# Patient Record
Sex: Female | Born: 1985 | Race: White | Hispanic: No | Marital: Single | Smoking: Never smoker
Health system: Southern US, Community
[De-identification: ages and names within clinical notes are randomized; demographics above are authoritative.]

## PROBLEM LIST (undated history)

## (undated) DIAGNOSIS — F32A Depression, unspecified: Secondary | ICD-10-CM

## (undated) DIAGNOSIS — A749 Chlamydial infection, unspecified: Secondary | ICD-10-CM

## (undated) DIAGNOSIS — F329 Major depressive disorder, single episode, unspecified: Secondary | ICD-10-CM

## (undated) DIAGNOSIS — R87610 Atypical squamous cells of undetermined significance on cytologic smear of cervix (ASC-US): Secondary | ICD-10-CM

## (undated) DIAGNOSIS — M21619 Bunion of unspecified foot: Secondary | ICD-10-CM

## (undated) DIAGNOSIS — R011 Cardiac murmur, unspecified: Secondary | ICD-10-CM

## (undated) DIAGNOSIS — R87612 Low grade squamous intraepithelial lesion on cytologic smear of cervix (LGSIL): Secondary | ICD-10-CM

## (undated) DIAGNOSIS — F502 Bulimia nervosa, unspecified: Secondary | ICD-10-CM

## (undated) DIAGNOSIS — F419 Anxiety disorder, unspecified: Secondary | ICD-10-CM

## (undated) HISTORY — DX: Chlamydial infection, unspecified: A74.9

## (undated) HISTORY — PX: FOOT SURGERY: SHX648

## (undated) HISTORY — DX: Atypical squamous cells of undetermined significance on cytologic smear of cervix (ASC-US): R87.610

## (undated) HISTORY — DX: Bulimia nervosa, unspecified: F50.20

## (undated) HISTORY — DX: Low grade squamous intraepithelial lesion on cytologic smear of cervix (LGSIL): R87.612

## (undated) HISTORY — DX: Bunion of unspecified foot: M21.619

## (undated) HISTORY — DX: Bulimia nervosa: F50.2

## (undated) HISTORY — DX: Major depressive disorder, single episode, unspecified: F32.9

## (undated) HISTORY — DX: Depression, unspecified: F32.A

## (undated) HISTORY — DX: Anxiety disorder, unspecified: F41.9

## (undated) SURGERY — Surgical Case
Anesthesia: Choice

---

## 2011-01-15 ENCOUNTER — Ambulatory Visit: Payer: Self-pay | Admitting: Internal Medicine

## 2014-11-17 DIAGNOSIS — A749 Chlamydial infection, unspecified: Secondary | ICD-10-CM

## 2014-11-17 DIAGNOSIS — R87610 Atypical squamous cells of undetermined significance on cytologic smear of cervix (ASC-US): Secondary | ICD-10-CM

## 2014-11-17 HISTORY — DX: Atypical squamous cells of undetermined significance on cytologic smear of cervix (ASC-US): R87.610

## 2014-11-17 HISTORY — DX: Chlamydial infection, unspecified: A74.9

## 2015-04-05 ENCOUNTER — Emergency Department
Admission: EM | Admit: 2015-04-05 | Discharge: 2015-04-05 | Disposition: A | Discharge status: Other Acute Inpatient Hospital | Payer: Self-pay | Attending: Emergency Medicine | Admitting: Emergency Medicine

## 2015-04-05 ENCOUNTER — Encounter: Payer: Self-pay | Admitting: *Deleted

## 2015-04-05 ENCOUNTER — Inpatient Hospital Stay
Admission: EM | Admit: 2015-04-05 | Discharge: 2015-04-08 | DRG: 881 | Disposition: A | Discharge status: Home / Self Care | Payer: No Typology Code available for payment source | Attending: Psychiatry | Admitting: Psychiatry

## 2015-04-05 DIAGNOSIS — Z720 Tobacco use: Secondary | ICD-10-CM | POA: Insufficient documentation

## 2015-04-05 DIAGNOSIS — Z9114 Patient's other noncompliance with medication regimen: Secondary | ICD-10-CM | POA: Diagnosis present

## 2015-04-05 DIAGNOSIS — F322 Major depressive disorder, single episode, severe without psychotic features: Secondary | ICD-10-CM

## 2015-04-05 DIAGNOSIS — Z590 Homelessness: Secondary | ICD-10-CM

## 2015-04-05 DIAGNOSIS — F10239 Alcohol dependence with withdrawal, unspecified: Secondary | ICD-10-CM | POA: Diagnosis present

## 2015-04-05 DIAGNOSIS — R45851 Suicidal ideations: Secondary | ICD-10-CM | POA: Diagnosis present

## 2015-04-05 DIAGNOSIS — G4700 Insomnia, unspecified: Secondary | ICD-10-CM | POA: Diagnosis present

## 2015-04-05 DIAGNOSIS — F32A Depression, unspecified: Secondary | ICD-10-CM

## 2015-04-05 DIAGNOSIS — F502 Bulimia nervosa: Secondary | ICD-10-CM | POA: Diagnosis present

## 2015-04-05 DIAGNOSIS — F329 Major depressive disorder, single episode, unspecified: Secondary | ICD-10-CM | POA: Diagnosis present

## 2015-04-05 DIAGNOSIS — F419 Anxiety disorder, unspecified: Secondary | ICD-10-CM | POA: Diagnosis present

## 2015-04-05 DIAGNOSIS — F10939 Alcohol use, unspecified with withdrawal, unspecified: Secondary | ICD-10-CM

## 2015-04-05 DIAGNOSIS — F332 Major depressive disorder, recurrent severe without psychotic features: Secondary | ICD-10-CM

## 2015-04-05 DIAGNOSIS — F1020 Alcohol dependence, uncomplicated: Secondary | ICD-10-CM

## 2015-04-05 LAB — ETHANOL

## 2015-04-05 LAB — COMPREHENSIVE METABOLIC PANEL
ALT: 21 U/L (ref 14–54)
ANION GAP: 9 (ref 5–15)
AST: 25 U/L (ref 15–41)
Albumin: 4.6 g/dL (ref 3.5–5.0)
Alkaline Phosphatase: 73 U/L (ref 38–126)
BILIRUBIN TOTAL: 0.8 mg/dL (ref 0.3–1.2)
BUN: 11 mg/dL (ref 6–20)
CO2: 27 mmol/L (ref 22–32)
Calcium: 9.2 mg/dL (ref 8.9–10.3)
Chloride: 103 mmol/L (ref 101–111)
Creatinine, Ser: 0.79 mg/dL (ref 0.44–1.00)
GFR calc Af Amer: >60 mL/min (ref >60)
GFR calc non Af Amer: >60 mL/min (ref >60)
Glucose, Bld: 101 mg/dL — ABNORMAL HIGH (ref 65–99)
Potassium: 4.2 mmol/L (ref 3.5–5.1)
SODIUM: 139 mmol/L (ref 135–145)
TOTAL PROTEIN: 7.1 g/dL (ref 6.5–8.1)

## 2015-04-05 LAB — SALICYLATE LEVEL

## 2015-04-05 LAB — CBC
HEMATOCRIT: 41.3 % (ref 35.0–47.0)
Hemoglobin: 13.9 g/dL (ref 12.0–16.0)
MCH: 33.8 pg (ref 26.0–34.0)
MCHC: 33.7 g/dL (ref 32.0–36.0)
MCV: 100.4 fL — ABNORMAL HIGH (ref 80.0–100.0)
PLATELETS: 187 10*3/uL (ref 150–440)
RBC: 4.11 MIL/uL (ref 3.80–5.20)
RDW: 15.1 % — ABNORMAL HIGH (ref 11.5–14.5)
WBC: 6.5 10*3/uL (ref 3.6–11.0)

## 2015-04-05 LAB — URINE DRUG SCREEN, QUALITATIVE (ARMC ONLY)
Amphetamines, Ur Screen: NOT DETECTED
BENZODIAZEPINE, UR SCRN: NOT DETECTED
Barbiturates, Ur Screen: NOT DETECTED
CANNABINOID 50 NG, UR ~~LOC~~: NOT DETECTED
Cocaine Metabolite,Ur ~~LOC~~: NOT DETECTED
MDMA (ECSTASY) UR SCREEN: NOT DETECTED
METHADONE SCREEN, URINE: NOT DETECTED
OPIATE, UR SCREEN: NOT DETECTED
Phencyclidine (PCP) Ur S: NOT DETECTED
Tricyclic, Ur Screen: NOT DETECTED

## 2015-04-05 LAB — PREGNANCY, URINE: Preg Test, Ur: NEGATIVE

## 2015-04-05 LAB — ACETAMINOPHEN LEVEL

## 2015-04-05 MED ORDER — CLONIDINE HCL 0.1 MG PO TABS
0.1000 mg | ORAL_TABLET | Freq: Once | ORAL | Status: AC
  Administered 2015-04-05: 0.1 mg via ORAL
  Filled 2015-04-05: qty 1

## 2015-04-05 MED ORDER — CHLORDIAZEPOXIDE HCL 25 MG PO CAPS
25.0000 mg | ORAL_CAPSULE | Freq: Three times a day (TID) | ORAL | Status: DC
  Administered 2015-04-05 – 2015-04-07 (×6): 25 mg via ORAL
  Filled 2015-04-05 (×6): qty 1

## 2015-04-05 MED ORDER — TRAZODONE HCL 100 MG PO TABS
100.0000 mg | ORAL_TABLET | Freq: Every evening | ORAL | Status: DC | PRN
  Administered 2015-04-05 – 2015-04-06 (×2): 100 mg via ORAL
  Filled 2015-04-05 (×2): qty 1

## 2015-04-05 MED ORDER — ALUM & MAG HYDROXIDE-SIMETH 200-200-20 MG/5ML PO SUSP
30.0000 mL | ORAL | Status: DC | PRN
Start: 2015-04-05 — End: 2015-04-08

## 2015-04-05 MED ORDER — MAGNESIUM HYDROXIDE 400 MG/5ML PO SUSP
30.0000 mL | Freq: Every day | ORAL | Status: DC | PRN
Start: 2015-04-05 — End: 2015-04-08

## 2015-04-05 MED ORDER — ACETAMINOPHEN 325 MG PO TABS
650.0000 mg | ORAL_TABLET | Freq: Four times a day (QID) | ORAL | Status: DC | PRN
  Administered 2015-04-05: 650 mg via ORAL
  Filled 2015-04-05: qty 2

## 2015-04-05 MED ORDER — LORAZEPAM 2 MG PO TABS: 2.0000 mg | ORAL_TABLET | Freq: Three times a day (TID) | ORAL | Status: DC | PRN

## 2015-04-05 MED ORDER — AMLODIPINE BESYLATE 5 MG PO TABS
5.0000 mg | ORAL_TABLET | Freq: Every day | ORAL | Status: DC
  Administered 2015-04-05 – 2015-04-06 (×2): 5 mg via ORAL
  Filled 2015-04-05 (×2): qty 1

## 2015-04-05 MED ORDER — CLONIDINE HCL 0.1 MG PO TABS: 0.1000 mg | ORAL_TABLET | Freq: Three times a day (TID) | ORAL | Status: DC | PRN
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
## 2015-04-05 NOTE — ED Notes (Signed)
BEHAVIORAL HEALTH ROUNDING Patient sleeping: No. Patient alert and oriented: yes Behavior appropriate: Yes.  ; If no, describe:  Nutrition and fluids offered: Yes  Toileting and hygiene offered: Yes  Sitter present: no Law enforcement present: Yes  

## 2015-04-05 NOTE — ED Notes (Signed)
BEHAVIORAL HEALTH ROUNDING Patient sleeping: No. Patient alert and oriented: yes Behavior appropriate: Yes.  ; If no, describe:  Nutrition and fluids offered: Yes  Toileting and hygiene offered: Yes  Sitter present: no Law enforcement present: Yes   Pt's mother at bedside
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
## 2015-04-05 NOTE — Tx Team (Signed)
Initial Interdisciplinary Treatment Plan   PATIENT STRESSORS: Financial difficulties Substance abuse   PATIENT STRENGTHS: Average or above average intelligence Capable of independent living Communication skills   PROBLEM LIST: Problem List/Patient Goals Date to be addressed Date deferred Reason deferred Estimated date of resolution  Family issues      Alcohol abuse                                                 DISCHARGE CRITERIA:  Improved stabilization in mood, thinking, and/or behavior Verbal commitment to aftercare and medication compliance Withdrawal symptoms are absent or subacute and managed without 24-hour nursing intervention  PRELIMINARY DISCHARGE PLAN: Return to previous living arrangement  PATIENT/FAMIILY INVOLVEMENT: This treatment plan has been presented to and reviewed with the patient, Monique Kaufman, and/or family member, .  The patient and family have been given the opportunity to ask questions and make suggestions.  Monique Kaufman B 04/05/2015, 7:43 PM

## 2015-04-05 NOTE — ED Notes (Signed)
Father at bedside.

## 2015-04-05 NOTE — ED Notes (Signed)
Patient assigned to appropriate care area. Patient oriented to unit/care area: Informed that, for their safety, care areas are designed for safety and monitored by security cameras at all times; and visiting hours explained to patient. Patient verbalizes understanding, and verbal contract for safety obtained. 

## 2015-04-05 NOTE — ED Notes (Signed)
Attempted to call report; no answer in Beh unit.
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
## 2015-04-05 NOTE — ED Notes (Signed)
[facility] will call when they are finished with current admission

## 2015-04-05 NOTE — ED Notes (Signed)
BEHAVIORAL HEALTH ROUNDING Patient sleeping: No. Patient alert and oriented: yes Behavior appropriate: Yes.  ; If no, describe:  Nutrition and fluids offered: Yes  Toileting and hygiene offered: Yes  Sitter present: no Law enforcement present: Yes BEHAVIORAL HEALTH ROUNDING Patient sleeping: No. Patient alert and oriented: yes Behavior appropriate: Yes.  ; If no, describe: Nutrition and fluids offered: Yes  Toileting and hygiene offered: Yes  Sitter present: no Law enforcement present: No
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
## 2015-04-05 NOTE — ED Notes (Signed)

## 2015-04-05 NOTE — ED Notes (Signed)
BEHAVIORAL HEALTH ROUNDING Patient sleeping: No. Patient alert and oriented: yes Behavior appropriate: Yes.  ; If no, describe: tearful Nutrition and fluids offered: Yes  Toileting and hygiene offered: No Sitter present: no Law enforcement present: Yes

## 2015-04-05 NOTE — ED Notes (Signed)
Pt comes in POV c/o suicidal ideation.  Denies suicidal plan but states " I would be better off not here".  Expresses family problems at home.  Patient states she has recently gone through a divorce in the past couple of months, loss of employment, and no family support.  Patient has recently been seeing a therapist and was escorted here by the therapist.

## 2015-04-05 NOTE — ED Notes (Signed)
Dr.Clapacs at bedside  

## 2015-04-05 NOTE — ED Provider Notes (Signed)
[facility] Emergency Department Provider Note    ____________________________________________  Time seen: 1120  I have reviewed the triage vital signs and the nursing notes.   HISTORY  Chief Complaint Suicidal   History limited by: Not Limited   HPI Monique Kaufman is a 29 y.o. female who presents to the emergency department accompanied by her counselor. The patient states that she has been having a hard time with her mental health recently. She states that she finds herself stealing from those that she lives. The guilt of stealing, most recently from her boyfriend causes it or have thoughts that people would be better off if she was not around. She does not have any plans to hurt herself nor has tried in the past. She states that she has had therapy for this in the past which has helped. She did move away from the area for a little while but then recently moved back. She has been talking to her counselor who brought her in today. She denies any medical complaints.     History reviewed. No pertinent past medical history.  There are no active problems to display for this patient.   History reviewed. No pertinent past surgical history.  No current outpatient prescriptions on file.  Allergies Review of patient's allergies indicates no known allergies.  No family history on file.  Social History History  Substance Use Topics  . Smoking status: Current Every Day Smoker  . Smokeless tobacco: Not on file  . Alcohol Use: Yes    Review of Systems  Constitutional: Negative for fever. Cardiovascular: Negative for chest pain. Respiratory: Negative for shortness of breath. Gastrointestinal: Negative for abdominal pain, vomiting and diarrhea. Genitourinary: Negative for dysuria. Musculoskeletal: Negative for back pain. Skin: Negative for rash. Neurological: Negative for headaches, focal weakness or numbness.  10-point ROS otherwise  negative.  ____________________________________________   PHYSICAL EXAM:  VITAL SIGNS: ED Triage Vitals  Enc Vitals Group     BP 04/05/15 0947 203/134 mmHg     Pulse Rate 04/05/15 0947 71     Resp 04/05/15 0947 18     Temp 04/05/15 0947 98.2 F (36.8 C)     Temp Source 04/05/15 0947 Oral     SpO2 04/05/15 0947 99 %     Weight 04/05/15 0947 130 lb (58.968 kg)     Height 04/05/15 0947  (1.6 m)   Constitutional: Alert and oriented. Tearful Eyes: Conjunctivae are normal. PERRL. Normal extraocular movements. ENT   Head: Normocephalic and atraumatic.   Nose: No congestion/rhinnorhea.   Mouth/Throat: Mucous membranes are moist.   Neck: No stridor. Hematological/Lymphatic/Immunilogical: No cervical lymphadenopathy. Cardiovascular: Normal rate, regular rhythm.  No murmurs, rubs, or gallops. Respiratory: Normal respiratory effort without tachypnea nor retractions. Breath sounds are clear and equal bilaterally. No wheezes/rales/rhonchi. Gastrointestinal: Soft and nontender. No distention. There is no CVA tenderness. Genitourinary: Deferred Musculoskeletal: Normal range of motion in all extremities. No joint effusions.  No lower extremity tenderness nor edema. Neurologic:  Normal speech and language. No gross focal neurologic deficits are appreciated. Speech is normal.  Skin:  Skin is warm, dry and intact. No rash noted. Psychiatric: Depressed, tearful  ____________________________________________    LABS (pertinent positives/negatives)  Labs Reviewed  COMPREHENSIVE METABOLIC PANEL - Abnormal; Notable for the following:    Glucose, Bld 101 (*)    All other components within normal limits  ACETAMINOPHEN LEVEL - Abnormal; Notable for the following:    Acetaminophen (Tylenol), Serum <10 (*)  All other components within normal limits  CBC - Abnormal; Notable for the following:    MCV 100.4 (*)    RDW 15.1 (*)    All other components within normal limits   ETHANOL  SALICYLATE LEVEL  URINE DRUG SCREEN, QUALITATIVE ([facility] ONLY)     ____________________________________________   EKG  None  ____________________________________________    RADIOLOGY  None  ____________________________________________   PROCEDURES  Procedure(s) performed: None  Critical Care performed: No  ____________________________________________   INITIAL IMPRESSION / ASSESSMENT AND PLAN / ED COURSE  Pertinent labs & imaging results that were available during my care of the patient were reviewed by me and considered in my medical decision making (see chart for details).  Patient presents with concerns for depression, some thoughts of not feeling like she should be here. Psychiatry has seen the patient and she will be admitted. Psychiatry agrees that she is not committable at this time however she is voluntarily willing to be admitted to the hospital.  ____________________________________________   FINAL CLINICAL IMPRESSION(S) / ED DIAGNOSES  Final diagnoses:  Depression     Phineas SemenGraydon Garlen Reinig, MD 04/05/15 1312

## 2015-04-06 DIAGNOSIS — F1020 Alcohol dependence, uncomplicated: Secondary | ICD-10-CM

## 2015-04-06 DIAGNOSIS — F502 Bulimia nervosa: Secondary | ICD-10-CM

## 2015-04-06 DIAGNOSIS — F322 Major depressive disorder, single episode, severe without psychotic features: Secondary | ICD-10-CM

## 2015-04-06 DIAGNOSIS — F10239 Alcohol dependence with withdrawal, unspecified: Secondary | ICD-10-CM

## 2015-04-06 DIAGNOSIS — F10939 Alcohol use, unspecified with withdrawal, unspecified: Secondary | ICD-10-CM

## 2015-04-06 LAB — VITAMIN B12: Vitamin B-12: 137 pg/mL — ABNORMAL LOW (ref 180–914)

## 2015-04-06 LAB — TSH: TSH: 1.004 u[IU]/mL (ref 0.350–4.500)

## 2015-04-06 MED ORDER — FLUOXETINE HCL 20 MG PO CAPS
20.0000 mg | ORAL_CAPSULE | Freq: Every day | ORAL | Status: DC
  Administered 2015-04-06 – 2015-04-08 (×3): 20 mg via ORAL
  Filled 2015-04-06 (×3): qty 1

## 2015-04-06 NOTE — Progress Notes (Signed)
Recreation Therapy Notes  Date: 07.12.16 Time: 3:00 pm Location: Craft Room  Group Topic: Self-esteem  Goal Area(s) Addresses:  Patient will write at least one positive trait about self. Patient will verbalize benefit of having a good self-esteem.  Behavioral Response: Attentive, Interactive   Intervention: I Am  Activity: Patients were given a worksheet with the letter I and instructed to fill the letter with positive traits about themselves.  Education: LRT educated patients on different ways to increase self-esteem   Education Outcome: Acknowledges education/In group clarification offered  Clinical Observations/Feedback: Patient completed worksheet by listing positive traits about herself. Patient contributed to group discussion by stating it was difficult to think of positive traits towards the end, and how it felt to see her strengths on paper, how her self-esteem affects her.  Jacquelynn CreeGreene,Liseth Wann M, LRT/CTRS 04/06/2015 4:06 PM

## 2015-04-06 NOTE — Plan of Care (Signed)
Problem: Ineffective individual coping Goal: LTG: Patient will report a decrease in negative feelings Outcome: Not Progressing Labile and tearful.  Continues to feel that she is a screw.

## 2015-04-06 NOTE — [facility] Group Notes (Signed)
[facility] Note  04/06/2015 3:13 PM  Participation Quality:  Appropriate  Affect:  Appropriate  Cognitive:  Appropriate  Insight:  Engaged  Engagement in Group:  Engaged  Modes of Intervention:  Discussion, Education, Exploration and Rapport Building  Summary of Progress/Problems: Patients smart goal for today is to stay positive, ask for meeting with SW and doctor and let her nurse know when she is feeling upset.  Arrie SenateBandi, Lania Zawistowski M 04/06/2015, 3:13 PM

## 2015-04-06 NOTE — Progress Notes (Signed)
Patient has been in the milieu. Currently in the day room with staff and peers. Alert and oriented, pleasant and cooperative. Currently denying SI/HI. Actively participated in evening group. Patient  Had a snack and took medication voluntarily. No withdrawal symptoms noted. Emotional support and encouragements offered and safety precautions maintained.

## 2015-04-06 NOTE — Plan of Care (Signed)
Problem: Ineffective individual coping Goal: STG: Patient will remain free from self harm Outcome: Progressing Patient remains safe, no self harm behavior

## 2015-04-06 NOTE — Progress Notes (Signed)
[facility] MD Progress Note  04/07/2015 10:55 AM PUNAM BROUSSARD  MRN:  [id number] Subjective:  Patient reports feeling better today. He feels that Prozac is already working. She denies SI, HI or having auditory or visual hallucinations. She denies side effects from the Prozac but complains of feeling overly sedated after taking the sleeping medication. She denies having any physical complaints today. She denies having any symptoms of alcohol withdrawal today. Patient denies having major problems with his sleep, appetite, energy or concentration. Patient tells me she was visited by her parents last night. They both have agreed to allow her to return to their house however they plan to have strict rules for her to follow.  Patient met with [facility] peer specialist.  She plans to follow up with them once discharged.  Principal Problem: MDD (major depressive disorder), single episode, severe , no psychosis Diagnosis:   Patient Active Problem List   Diagnosis Date Noted  . MDD (major depressive disorder), single episode, severe , no psychosis [F32.2] 04/06/2015  . Alcohol use disorder, severe, dependence [F10.20] 04/06/2015  . Alcohol withdrawal [F10.239] 04/06/2015  . Bulimia nervosa [F50.2] 04/06/2015   Total Time spent with patient: 30 minutes   Past Medical History: History reviewed. No pertinent past medical history. History reviewed. No pertinent past surgical history. Family History: History reviewed. No pertinent family history. Social History:  History  Alcohol Use  . Yes     History  Drug Use No    History   Social History  . Marital Status: Single    Spouse Name: N/A  . Number of Children: N/A  . Years of Education: N/A   Social History Main Topics  . Smoking status: Never Smoker   . Smokeless tobacco: Never Used  . Alcohol Use: Yes  . Drug Use: No  . Sexual Activity: Yes    Birth Control/ Protection: Condom   Other Topics Concern  . None   Social History Narrative    Additional History:    Sleep: Good  Appetite:  Good   Assessment:   Musculoskeletal: Strength & Muscle Tone: within normal limits Gait & Station: normal Patient leans: N/A   Psychiatric Specialty Exam: Physical Exam  Review of Systems  Constitutional: Negative.   HENT: Negative.   Eyes: Negative.   Respiratory: Negative.   Cardiovascular: Negative.   Gastrointestinal: Negative.   Genitourinary: Negative.   Musculoskeletal: Negative.   Skin: Negative.   Neurological: Negative.   Endo/Heme/Allergies: Negative.   Psychiatric/Behavioral: Negative.     Blood pressure 115/64, pulse 64, temperature 97.9 F (36.6 C), temperature source Oral, resp. rate 20, height '5\' 3"'  (1.6 m), weight 56.7 kg (125 lb), last menstrual period 04/05/2015, SpO2 98 %.Body mass index is 22.15 kg/(m^2).  General Appearance: Fairly Groomed  Engineer, water::  Good  Speech:  Clear and Coherent  Volume:  Normal  Mood:  Dysphoric  Affect:  Congruent  Thought Process:  Logical  Orientation:  Full (Time, Place, and Person)  Thought Content:  Hallucinations: None  Suicidal Thoughts:  No  Homicidal Thoughts:  No  Memory:  Immediate;   Good Recent;   Good Remote;   Good  Judgement:  Fair  Insight:  Fair  Psychomotor Activity:  Normal  Concentration:  Good  Recall:  NA  Fund of Knowledge:Good  Language: Good  Akathisia:  No  Handed:    AIMS (if indicated):     Assets:  Geophysical data processor Vocational/Educational  ADL's:  Intact  Cognition:  WNL  Sleep:  Number of Hours: 6.25     Current Medications: Current Facility-Administered Medications  Medication Dose Route Frequency Provider Last Rate Last Dose  . acetaminophen (TYLENOL) tablet 650 mg  650 mg Oral Q6H PRN Gonzella Lex, MD   650 mg at 04/05/15 1817  . alum & mag hydroxide-simeth (MAALOX/MYLANTA) 200-200-20 MG/5ML suspension 30 mL  30 mL Oral Q4H PRN Gonzella Lex, MD      . chlordiazePOXIDE  (LIBRIUM) capsule 25 mg  25 mg Oral TID Hildred Priest, MD   25 mg at 04/07/15 0845  . cloNIDine (CATAPRES) tablet 0.1 mg  0.1 mg Oral TID PRN Hildred Priest, MD      . FLUoxetine (PROZAC) capsule 20 mg  20 mg Oral Daily Hildred Priest, MD   20 mg at 04/07/15 0845  . LORazepam (ATIVAN) tablet 2 mg  2 mg Oral TID PRN Hildred Priest, MD      . magnesium hydroxide (MILK OF MAGNESIA) suspension 30 mL  30 mL Oral Daily PRN Gonzella Lex, MD      . traZODone (DESYREL) tablet 100 mg  100 mg Oral QHS PRN Gonzella Lex, MD   100 mg at 04/06/15 2243    Lab Results:  Results for orders placed or performed during the hospital encounter of 04/05/15 (from the past 48 hour(s))  Pregnancy, urine     Status: None   Collection Time: 04/05/15  6:42 PM  Result Value Ref Range   Preg Test, Ur NEGATIVE NEGATIVE  TSH     Status: None   Collection Time: 04/06/15  3:25 PM  Result Value Ref Range   TSH 1.004 0.350 - 4.500 uIU/mL  Vitamin B12     Status: Abnormal   Collection Time: 04/06/15  3:25 PM  Result Value Ref Range   Vitamin B-12 137 (L) 180 - 914 pg/mL    Comment: (NOTE) This assay is not validated for testing neonatal or myeloproliferative syndrome specimens for Vitamin B12 levels. Performed at [facility]     Physical Findings: AIMS: Facial and Oral Movements Muscles of Facial Expression: None, normal Lips and Perioral Area: None, normal Jaw: None, normal Tongue: None, normal,Extremity Movements Upper (arms, wrists, hands, fingers): None, normal Lower (legs, knees, ankles, toes): None, normal, Trunk Movements Neck, shoulders, hips: None, normal, Overall Severity Severity of abnormal movements (highest score from questions above): None, normal Incapacitation due to abnormal movements: None, normal, Dental Status Current problems with teeth and/or dentures?: No Does patient usually wear dentures?: No  CIWA:  CIWA-Ar Total: 0 COWS:      Treatment Plan Summary: Daily contact with patient to assess and evaluate symptoms and progress in treatment and Medication management   29 year old with major depressive disorder, alcohol abuse in bulimia presents to the hospital with suicidal ideation. Patient has a long pattern of lying and stealing and displaying impulsive behaviors. Patient has never faced legal charges before. This is the first time that the situation has escalated to the point where charges might be brought on her for credit card fraud.  Major depressive disorder: Patient will be started on fluoxetine 20 mg by mouth daily to address depression and impulsive behaviors  Alcohol use disorder: Patient will be referred to an outpatient substance abuse treatment at discharge  Alcohol withdrawal: Patient has been is started on benzodiazepine taper. Her blood pressure was highly elevated in the 200 range on July 11 today blood pressure appears to have normalized.  Insomnia: Continue trazodone  100 mg by mouth daily at bedtime when necessary  Labs I will order TSH, vitamin B12  Precautions continue every 15 minute checks  Hospitalization and status continue involuntary commitment  Discharge disposition: Once a stable the patient will be discharged either to her parents home or local homeless shelter. The patient will be scheduled to follow-up with [facility].    Medical Decision Making:  Established Problem, Stable/Improving (1)     Hildred Priest 04/07/2015, 10:55 AM

## 2015-04-06 NOTE — Progress Notes (Signed)
29 year old patient admitted with major depression.  Received from ED via wheelchair in scrubs.  Patient tearful and anxious.  Body odor noted. Body search and skin assessment performed.  No contraband found.  Skin unremarkable.

## 2015-04-06 NOTE — [facility] Group Notes (Signed)
[facility] LCSW Group Therapy  04/06/2015 3:11 PM  Type of Therapy:  Group Therapy  Participation Level:  Active  Participation Quality:  Attentive  Affect:  Appropriate  Cognitive:  Appropriate  Insight:  Engaged  Engagement in Therapy:  Engaged  Modes of Intervention:  Discussion, Education, Exploration and Support  Summary of Progress/Problems::LCSW reviewed group rules with patients. LCSW reviewed the stages of growth development and the need for change. This patient was able to understand the 5 stages, Insight,Self Questioning, Self Awareness,Acknowledgement Self realization and acceptance. Patient reported that she want to change all the things wrong with her life but understood this 5 stage process and taking baby steps first.  Cheron SchaumannBandi, Zerline Melchior M 04/06/2015, 3:11 PM

## 2015-04-06 NOTE — [facility] Group Notes (Signed)
[facility] Group Notes:  (Nursing/MHT/Case Management/Adjunct)  Date:  04/06/2015  Time:  3:58 PM  Type of Therapy:  Psychoeducational Skills  Participation Level:  Active  Participation Quality:  Appropriate, Attentive and Sharing  Affect:  Appropriate  Cognitive:  Alert and Appropriate  Insight:  Appropriate and Good  Engagement in Group:  Engaged  Modes of Intervention:  Discussion, Education and Support  Summary of Progress/Problems:  Monique Kaufman 04/06/2015, 3:58 PM

## 2015-04-06 NOTE — Plan of Care (Signed)
Problem: Alteration in mood Goal: LTG-Patient reports reduction in suicidal thoughts (Patient reports reduction in suicidal thoughts and is able to verbalize a safety plan for whenever patient is feeling suicidal)  Outcome: Progressing Denies SI     

## 2015-04-06 NOTE — Plan of Care (Signed)
Problem: Ineffective individual coping Goal: STG: Patient will participate in after care plan Outcome: Progressing Patient attending groups and willing to learn
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
## 2015-04-06 NOTE — [facility] Suicide Risk Assessment (Signed)
[facility]   Nursing information obtained from:    Demographic factors:    Current Mental Status:    Loss Factors:    Historical Factors:    Risk Reduction Factors:    Total Time spent with patient: 1 hour Principal Problem: MDD (major depressive disorder), single episode, severe , no psychosis Diagnosis:   Patient Active Problem List   Diagnosis Date Noted  . MDD (major depressive disorder), single episode, severe , no psychosis [F32.2] 04/06/2015  . Alcohol use disorder, severe, dependence [F10.20] 04/06/2015  . Alcohol withdrawal [F10.239] 04/06/2015  . Bulimia nervosa [F50.2] 04/06/2015     Continued Clinical Symptoms:  Alcohol Use Disorder Identification Test Final Score (AUDIT): 36 The "Alcohol Use Disorders Identification Test", Guidelines for Use in Primary Care, Second Edition.  World Science writerHealth Organization [facility](WHO). Score between 0-7:  no or low risk or alcohol related problems. Score between 8-15:  moderate risk of alcohol related problems. Score between 16-19:  high risk of alcohol related problems. Score 20 or above:  warrants further diagnostic evaluation for alcohol dependence and treatment.   CLINICAL FACTORS:   Severe Anxiety and/or Agitation Depression:   Comorbid alcohol abuse/dependence Impulsivity Severe Alcohol/Substance Abuse/Dependencies More than one psychiatric diagnosis    Psychiatric Specialty Exam: Physical Exam  ROS    COGNITIVE FEATURES THAT CONTRIBUTE TO RISK:  None    SUICIDE RISK:   Moderate:  Frequent suicidal ideation with limited intensity, and duration, some specificity in terms of plans, no associated intent, good self-control, limited dysphoria/symptomatology, some risk factors present, and identifiable protective factors, including available and accessible social support.  [facility]   Medical Decision Making:  Established Problem, Stable/Improving (1)  I certify that inpatient  services furnished can reasonably be expected to improve the patient's condition.   Jimmy FootmanHernandez-Gonzalez,  Ved Martos 04/06/2015, 11:48 AM

## 2015-04-06 NOTE — Plan of Care (Signed)
Problem: Ineffective individual coping Goal: LTG: Patient will report a decrease in negative feelings Outcome: Progressing Patient pleasant and denies SI at this moment
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
## 2015-04-06 NOTE — Progress Notes (Signed)
D: Pt is awake and active in the milieu his evening. Pt mood is depressed and her affect is sad. Pt forwards little, but is pleasant and cooperative with staff.  A: Writer provided emotional support and administered medications as prescribed.   R: Pt went to bed shortly after medications administration.
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
## 2015-04-06 NOTE — Progress Notes (Signed)
Denies SI.  Continues to be tearful and sad.  Continues to feel like something is wrong with her.  Medication and group compliant. Visible on unit.  No inappropriate behavior noted.

## 2015-04-07 DIAGNOSIS — F32A Depression, unspecified: Secondary | ICD-10-CM | POA: Diagnosis present

## 2015-04-07 DIAGNOSIS — F329 Major depressive disorder, single episode, unspecified: Secondary | ICD-10-CM | POA: Diagnosis present

## 2015-04-07 MED ORDER — TRAZODONE HCL 50 MG PO TABS
50.0000 mg | ORAL_TABLET | Freq: Every evening | ORAL | Status: DC | PRN
  Filled 2015-04-07: qty 1

## 2015-04-07 MED ORDER — TRAZODONE HCL 50 MG PO TABS: 50.0000 mg | ORAL_TABLET | Freq: Every evening | ORAL | Status: DC | PRN

## 2015-04-07 MED ORDER — FLUOXETINE HCL 20 MG PO CAPS: 20.0000 mg | ORAL_CAPSULE | Freq: Every day | ORAL | Status: DC

## 2015-04-07 NOTE — Tx Team (Signed)
Interdisciplinary Treatment Plan Update (Adult)  Date:  04/07/2015  Time Reviewed: 4:04 PM   Progress in Treatment: Attending groups: Yes. Participating in groups:  Yes. Taking medication as prescribed:  Yes. Tolerating medication:  Yes. Family/Significant othe contact made:  Yes, individual(s) contacted:  Abelardo DieselPaula Utley (Mother)  Patient understands diagnosis:  Yes. Discussing patient identified problems/goals with staff: Yes Medical problems stabilized or resolved:  Yes. Denies suicidal/homicidal ideation:  Yes  Issues/concerns per patient self-inventory: NA Other:  New problem(s) identified: N/A  Discharge Plan or Barriers:  Pt plans to return home with mom and [facility]   Reason for Continuation of Hospitalization: Substance abuse  SI Depression  Comments: Patient reports feeling better today. He feels that Prozac is already working. She denies SI, HI or having auditory or visual hallucinations. She denies side effects from the Prozac but complains of feeling overly sedated after taking the sleeping medication. She denies having any physical complaints today. She denies having any symptoms of alcohol withdrawal today. Patient denies having major problems with his sleep, appetite, energy or concentration. Patient tells me she was visited by her parents last night. They both have agreed to allow her to return to their house however they plan to have strict rules for her to follow.  Estimated length of stay: Pt will likely discharge tomorrow.   New goal(s): NA  Review of initial/current patient goals per problem list:   Attendees: Patient: Monique Kaufman  04/07/2015 4:05 PM   Family:     Physician:  Radene JourneyAndrea Hernandez, MD 04/07/2015  Nursing:    Henrene PastorJanet, RN  04/07/2015  Clinical Social Work:Markavious Micco, LCSWA, Beryl MeagerJason Ingle, Theresia MajorsLCSWA, Wilford Gristara Mitchell, LCSW 04/07/2015  Other:     Other:    Other:     Other:     Other:    Other:    Other:    Other:    Other:    Other:     Other:   6/23/20162:27 PM   Scribe for Treatment Team:   Rondall Allegraandace L Andrw Mcguirt, MSW, LCSWA 04/07/2015 4:04 PM

## 2015-04-07 NOTE — Progress Notes (Signed)
Recreation Therapy Notes  INPATIENT RECREATION TR PLAN  Patient Details Name: Monique Kaufman MRN: [id number] DOB: October 04, 1985 Today's Date: 04/07/2015  Rec Therapy Plan Is patient appropriate for Therapeutic Recreation?: Yes Treatment times per week: At least once a week TR Treatment/Interventions: 1:1 session, Group participation (Comment) (Appropriate participation in daily recreation therapy tx)  Discharge Criteria Pt will be discharged from therapy if:: Discharged Treatment plan/goals/alternatives discussed and agreed upon by:: Patient/family  Discharge Summary Short term goals set: See Care Plan Short term goals met: Complete Progress toward goals comments: One-to-one attended Which groups?: Self-esteem, Leisure education One-to-one attended: Self-esteem, stress management, coping skills Reason goals not met: N/A Therapeutic equipment acquired: None Reason patient discharged from therapy: Treatment goals met Pt/family agrees with progress & goals achieved: Yes Date patient discharged from therapy: 04/07/15   Leonette Monarch, LRT/CTRS 04/07/2015, 4:33 PM
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
## 2015-04-07 NOTE — [facility] Suicide Risk Assessment (Signed)
[facility] INPATIENT:  Family/Significant Other Suicide Prevention Education  Suicide Prevention Education:  Education Completed: Monique Kaufman [phone](mother),[phone] ;has been identified by the patient as the family member/significant other with whom the patient will be residing, and identified as the person(s) who will aid the patient in the event of a mental health crisis (suicidal ideations/suicide attempt).  With written consent from the patient, the family member/significant other has been provided the following suicide prevention education, prior to the and/or following the discharge of the patient.  The suicide prevention education provided includes the following:  Suicide risk factors  Suicide prevention and interventions  National Suicide Hotline telephone number  [facility] assessment telephone number  [facility] Emergency Assistance 911  [facility] and/or Residential Mobile Crisis Unit telephone number  Request made of family/significant other to:  Remove weapons (e.g., guns, rifles, knives), all items previously/currently identified as safety concern.    Remove drugs/medications (over-the-counter, prescriptions, illicit drugs), all items previously/currently identified as a safety concern.  The family member/significant other verbalizes understanding of the suicide prevention education information provided.  The family member/significant other agrees to remove the items of safety concern listed above.  Sempra EnergyCandace L Jaleeah Slight MSW, LCSWA  04/07/2015, 4:01 PM

## 2015-04-07 NOTE — [facility] Group Notes (Signed)
[facility] Group Notes:  (Nursing/MHT/Case Management/Adjunct)  Date:  04/07/2015  Time:  11:23 PM  Type of Therapy:  Group Therapy  Participation Level:  Active  Participation Quality:  Appropriate and Attentive  Affect:  Appropriate  Cognitive:  Alert and Appropriate  Insight:  Appropriate  Engagement in Group:  Engaged  Modes of Intervention:  Discussion  Summary of Progress/Problems:  Rhianon Zabawa Joy Beverlyn Mcginness 04/07/2015, 11:23 PM

## 2015-04-07 NOTE — Progress Notes (Signed)
Patient with appropriate affect and cooperative behavior with meds and plan of care. Sleepy in bed this am. Fair adls.  Denies SI/HI/AVH at this time. Encouraged therapy groups to learn and initiate coping skills for management of stressors and diagnosis. Safety maintained.

## 2015-04-07 NOTE — Progress Notes (Signed)
  [facility] :  Will you be returning to the same living situation after discharge:  No. At discharge, do you have transportation home?: Yes,  Mother  Do you have the ability to pay for your medications: Yes,  [facility]  Release of information consent forms completed and in the chart;  Patient's signature needed at discharge.  Patient to Follow up at: Follow-up Information    Follow up with [facility] On 04/11/2015.   Why:  For your first appointment, you will need to go to the walk in clinic on Monday, July, 18th at 7:30am. Please take all of your hospital paperwork.    Contact information:   [address] [place] [address] [phone]       Patient denies SI/HI: Yes,  Yes    Safety Planning and Suicide Prevention discussed: Yes,  With patient and mother   Have you used any form of tobacco in the last 30 days? (Cigarettes, Smokeless Tobacco, Cigars, and/or Pipes): No  Has patient been referred to the Quitline?: N/A patient is not a smoker  Sempra EnergyCandace L Jadarion Halbig MSW, LCSWA  04/07/2015, 4:03 PM

## 2015-04-07 NOTE — [facility] Group Notes (Signed)
[facility] Group Notes:  (Nursing/MHT/Case Management/Adjunct)  Date:  04/07/2015  Time:  2:57 PM  Type of Therapy:  Psychoeducational Skills  Participation Level:  Active  Participation Quality:  Appropriate, Attentive, Sharing and Supportive  Affect:  Appropriate  Cognitive:  Alert and Appropriate  Insight:  Appropriate and Good  Engagement in Group:  Engaged and Supportive  Modes of Intervention:  Discussion, Education and Support  Summary of Progress/Problems:  Lynelle SmokeCara Travis Dailon Sheeran 04/07/2015, 2:57 PM
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
## 2015-04-07 NOTE — [facility] Group Notes (Signed)
[facility] Group Notes:  (Nursing/MHT/Case Management/Adjunct)  Date:  04/07/2015  Time:  5:05 AM  Type of Therapy:  Group Therapy  Participation Level:  Active  Participation Quality:  Appropriate  Affect:  Appropriate  Cognitive:  Appropriate  Insight:  Good  Engagement in Group:  Engaged  Modes of Intervention:  n/a  Summary of Progress/Problems:  Veva Holesshley Imani Winston Sobczyk 04/07/2015, 5:05 AM

## 2015-04-07 NOTE — Plan of Care (Signed)
Problem: [facility] Participation in Recreation Therapeutic Interventions Goal: STG-Patient will demonstrate improved self esteem by identif STG: Self-Esteem - Within 3 treatment sessions, patient will verbalize at least 5 positive affirmation statements in one treatment session to increase self-esteem post d/c.  Outcome: Completed/Met Date Met:  04/07/15 Treatment Session 1; Completed 1 out of 1: At approximately 10:45 am, LRT met with patient in craft room. Patient verbalized 5 positive affirmation statements. Patient reported it felt "pretty good". LRT encouraged patient to continue saying positive affirmation statements.  Leonette Monarch, LRT/CTRS 07.14.16 11:45 am Goal: STG-Patient will identify at least five coping skills for ** STG: Coping Skills - Within 3 treatment sessions, patient will verbalize at least 5 coping skills for substance abuse in one treatment session to decrease substance abuse post d/c.  Outcome: Completed/Met Date Met:  04/07/15 Treatment Session 1; Completed 1 out of 1: At approximately 10:45 am, LRT met with patient in craft room. Patient verbalized 5 coping skills for substance abuse. LRT educated patient on leisure and why it is important to implement it into her schedule. LRT provided patient with blank schedules to help her plan her day and try to avoid using substances. LRT educated patient on healthy support systems and why they are important.  Leonette Monarch, LRT/CTRS 07.14.16 11:49 am Goal: STG-Other Recreation Therapy Goal (Specify) STG: Stress Management - Within 3 treatment sessions, patient will verbalize understanding of the stress management techniques in one treatment session to increase stress management skills post d/c.  Outcome: Completed/Met Date Met:  04/07/15 Treatment Session 1; Completed 1 out of 1: At approximately 10:45 am, LRT met with patient in craft room. LRT educated and provided patient with handouts on stress management techniques.  Patient verbalized understanding. LRT encouraged patient to read over and practice the stress management techniques.  Leonette Monarch, LRT/CTRS 07.14.16 11:50 am

## 2015-04-07 NOTE — [facility] Suicide Risk Assessment (Addendum)
[facility]   Demographic Factors:  Divorced or widowed, Caucasian, Low socioeconomic status and Unemployed  Total Time spent with patient: 30 minutes  Psychiatric Specialty Exam: Physical Exam  ROS  Blood pressure 115/74, pulse 55, temperature 97.8 F (36.6 C), temperature source Oral, resp. rate 20, height 5\' 3"  (1.6 m), weight 56.7 kg (125 lb), last menstrual period 04/05/2015, SpO2 98 %.Body mass index is 22.15 kg/(m^2).                                                       Have you used any form of tobacco in the last 30 days? (Cigarettes, Smokeless Tobacco, Cigars, and/or Pipes): No  Has this patient used any form of tobacco in the last 30 days? (Cigarettes, Smokeless Tobacco, Cigars, and/or Pipes) No  Mental Status Per Nursing Assessment::   On Admission:     Current Mental Status by Physician: Hopeful, future oriented. Mood euthymic, affect reactive. Pleasant, calm and cooperative. Participating in groups. Compliant with treatment. Good interaction with staff and peers. Denies SI, HI or auditory or visual hallucinations.  Loss Factors: Loss of significant relationship, Legal issues and Financial problems/change in socioeconomic status  Historical Factors: Impulsivity  Risk Reduction Factors:   Positive social support  Continued Clinical Symptoms:  Depression:   Comorbid alcohol abuse/dependence Insomnia Alcohol/Substance Abuse/Dependencies More than one psychiatric diagnosis  Cognitive Features That Contribute To Risk:  None    Suicide Risk:  Minimal: No identifiable suicidal ideation.  Patients presenting with no risk factors but with morbid ruminations; may be classified as minimal risk based on the severity of the depressive symptoms  Principal Problem: MDD (major depressive disorder), single episode, severe , no psychosis Discharge Diagnoses:  Patient Active Problem List   Diagnosis Date Noted  .  Depression [F32.9] 04/07/2015  . MDD (major depressive disorder), single episode, severe , no psychosis [F32.2] 04/06/2015  . Alcohol use disorder, severe, dependence [F10.20] 04/06/2015  . Alcohol withdrawal [F10.239] 04/06/2015  . Bulimia nervosa [F50.2] 04/06/2015    Follow-up Information    Follow up with [facility] On 04/11/2015.   Why:  For your first appointment, you will need to go to the walk in clinic on Monday, July, 18th at 7:30am. Please take all of your hospital paperwork.    Contact information:   [address] [place] [address] [phone]       Plan Of Care/Follow-up recommendations:  Other:  Follow-up with [facility]  Is patient on multiple antipsychotic therapies at discharge:  No   Has Patient had three or more failed trials of antipsychotic monotherapy by history:  No  Recommended Plan for Multiple Antipsychotic Therapies: NA    Jimmy FootmanHernandez-Gonzalez,  Wilmer Santillo 04/08/2015, 8:57 AM
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
## 2015-04-07 NOTE — Progress Notes (Signed)
Recreation Therapy Notes  Date: 07.14.16 Time: 3:00 pm Location: Craft Room  Group Topic: Leisure Education  Goal Area(s) Addresses:  Patient will identify activities for each letter of the alphabet. Patient will verbalize ability to integrate positive leisure into life post d/c. Patient will verbalize ability to use leisure as a coping mechanism.  Behavioral Response: Attentive, Interactive  Intervention: Leisure Alphabet  Activity: Patients were given a Leisure Information systems managerAlphabet worksheet and instructed to list a positive leisure activity for each letter of the alphabet.   Education: LRT educated patients on what is needed to participate in leisure activities.  Education Outcome: Acknowledges education/In group clarification offered/Needs additional education.   Clinical Observations/Feedback: Patient completed worksheet by filling out leisure activities. Patient contributed to group discussion by listing some activities she came up with, what she needs to participate in leisure, how she can integrate leisure into her schedule, and why it is important to participate in leisure.  Jacquelynn CreeGreene,Numan Zylstra M, LRT/CTRS 04/07/2015 4:27 PM

## 2015-04-08 NOTE — Progress Notes (Signed)
AVS H&P Discharge Summary faxed to [facility] for hospital follow-up °

## 2015-04-08 NOTE — [facility] Group Notes (Signed)
[facility] Group Notes:  (Nursing/MHT/Case Management/Adjunct)  Date:  04/08/2015  Time:  12:07 PM  Type of Therapy:  Psychoeducational Skills  Participation Level:  Active  Participation Quality:  Attentive and Monopolizing  Affect:  Appropriate  Cognitive:  Appropriate  Insight:  Improving  Engagement in Group:  Monopolizing  Modes of Intervention:  Support  Summary of Progress/Problems:  Monique Kaufman 04/08/2015, 12:07 PM

## 2015-04-08 NOTE — Progress Notes (Signed)
D: Patient denies SI/HI/AVH.  Patient affect and mood are appropriate.  Patient did attend evening group. Patient visible on the milieu. No distress noted. A: Support and encouragement offered. Scheduled medications given to pt. Q 15 min checks continued for patient safety. R: Patient receptive. Patient remains safe on the unit.

## 2015-04-08 NOTE — Progress Notes (Signed)
Patient discharged ambulatory to home, accompanied by father. She denies SI or HI. Discharge instructions reviewed with patient, she verbalizes understanding. Patient received copy of DC plan, prescriptions, 7 day supply of medications, and all personal belongings.

## 2015-04-14 ENCOUNTER — Ambulatory Visit: Payer: Self-pay

## 2016-05-23 ENCOUNTER — Encounter: Payer: Self-pay | Admitting: Emergency Medicine

## 2016-05-23 DIAGNOSIS — Y9389 Activity, other specified: Secondary | ICD-10-CM | POA: Insufficient documentation

## 2016-05-23 DIAGNOSIS — Y999 Unspecified external cause status: Secondary | ICD-10-CM | POA: Insufficient documentation

## 2016-05-23 DIAGNOSIS — S01511A Laceration without foreign body of lip, initial encounter: Secondary | ICD-10-CM | POA: Insufficient documentation

## 2016-05-23 DIAGNOSIS — W1800XA Striking against unspecified object with subsequent fall, initial encounter: Secondary | ICD-10-CM | POA: Insufficient documentation

## 2016-05-23 DIAGNOSIS — Y929 Unspecified place or not applicable: Secondary | ICD-10-CM | POA: Insufficient documentation

## 2016-05-23 DIAGNOSIS — Z79899 Other long term (current) drug therapy: Secondary | ICD-10-CM | POA: Insufficient documentation
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
## 2016-05-24 ENCOUNTER — Emergency Department
Admission: EM | Admit: 2016-05-24 | Discharge: 2016-05-24 | Disposition: A | Discharge status: Home / Self Care | Payer: Self-pay | Attending: Emergency Medicine | Admitting: Emergency Medicine

## 2016-05-24 DIAGNOSIS — S01511A Laceration without foreign body of lip, initial encounter: Secondary | ICD-10-CM

## 2016-05-24 MED ORDER — PENICILLIN V POTASSIUM 500 MG PO TABS: 500.0000 mg | ORAL_TABLET | Freq: Four times a day (QID) | ORAL | 0 refills | Status: AC
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
## 2016-05-24 NOTE — ED Notes (Signed)
Pt to triage for reassessment; small avulsion type lac to inside lower lip with scant bleeding; no dental injuries noted or reported; some tenderness with palpation to chin; ice pack given to pt with instructions on use and pt updated on wait time

## 2016-07-04 DIAGNOSIS — R87612 Low grade squamous intraepithelial lesion on cytologic smear of cervix (LGSIL): Secondary | ICD-10-CM

## 2016-07-04 HISTORY — DX: Low grade squamous intraepithelial lesion on cytologic smear of cervix (LGSIL): R87.612

## 2016-07-04 LAB — OB RESULTS CONSOLE HEPATITIS B SURFACE ANTIGEN: HEP B S AG: NEGATIVE

## 2016-07-04 LAB — OB RESULTS CONSOLE VARICELLA ZOSTER ANTIBODY, IGG
VARICELLA IGG: NON-IMMUNE/NOT IMMUNE
VARICELLA IGG: IMMUNE
VARICELLA IGG: IMMUNE
Varicella: IMMUNE

## 2016-07-04 LAB — OB RESULTS CONSOLE HGB/HCT, BLOOD
HCT: 35 %
HEMOGLOBIN: 12.4 g/dL

## 2016-07-04 LAB — OB RESULTS CONSOLE RUBELLA ANTIBODY, IGM
RUBELLA: IMMUNE
Rubella: IMMUNE
Rubella: IMMUNE
Rubella: NON-IMMUNE/NOT IMMUNE

## 2016-07-04 LAB — OB RESULTS CONSOLE RPR: RPR: NONREACTIVE

## 2016-07-04 LAB — OB RESULTS CONSOLE GC/CHLAMYDIA
CHLAMYDIA, DNA PROBE: NEGATIVE
Gonorrhea: NEGATIVE

## 2016-07-04 LAB — OB RESULTS CONSOLE ABO/RH: RH TYPE: POSITIVE

## 2016-07-04 LAB — HM PAP SMEAR

## 2016-07-04 LAB — OB RESULTS CONSOLE HIV ANTIBODY (ROUTINE TESTING): HIV: NONREACTIVE

## 2016-07-04 LAB — OB RESULTS CONSOLE PLATELET COUNT: PLATELETS: 232 10*3/uL

## 2016-08-08 ENCOUNTER — Other Ambulatory Visit: Payer: Self-pay | Admitting: Obstetrics & Gynecology

## 2016-08-08 DIAGNOSIS — Z3689 Encounter for other specified antenatal screening: Secondary | ICD-10-CM

## 2016-08-08 DIAGNOSIS — O0990 Supervision of high risk pregnancy, unspecified, unspecified trimester: Secondary | ICD-10-CM

## 2016-08-08 HISTORY — PX: COLPOPEXY: SHX920

## 2016-09-20 ENCOUNTER — Encounter: Payer: Self-pay | Admitting: *Deleted

## 2016-09-20 ENCOUNTER — Ambulatory Visit
Admission: RE | Admit: 2016-09-20 | Discharge: 2016-09-20 | Disposition: A | Discharge status: Home / Self Care | Payer: Medicaid Other | Source: Ambulatory Visit | Attending: Maternal and Fetal Medicine | Admitting: Maternal and Fetal Medicine

## 2016-09-20 DIAGNOSIS — Z3A18 18 weeks gestation of pregnancy: Secondary | ICD-10-CM | POA: Diagnosis not present

## 2016-09-20 DIAGNOSIS — Z3689 Encounter for other specified antenatal screening: Secondary | ICD-10-CM

## 2016-09-20 HISTORY — DX: Cardiac murmur, unspecified: R01.1

## 2016-11-29 ENCOUNTER — Other Ambulatory Visit: Payer: Medicaid Other

## 2016-11-29 ENCOUNTER — Ambulatory Visit (INDEPENDENT_AMBULATORY_CARE_PROVIDER_SITE_OTHER): Payer: Medicaid Other | Admitting: Obstetrics & Gynecology

## 2016-11-29 VITALS — BP 120/80 | HR 60 | Wt 146.0 lb

## 2016-11-29 DIAGNOSIS — O0990 Supervision of high risk pregnancy, unspecified, unspecified trimester: Secondary | ICD-10-CM | POA: Insufficient documentation

## 2016-11-29 DIAGNOSIS — Z3A29 29 weeks gestation of pregnancy: Secondary | ICD-10-CM

## 2016-11-29 DIAGNOSIS — Z131 Encounter for screening for diabetes mellitus: Secondary | ICD-10-CM

## 2016-11-29 DIAGNOSIS — Z3A28 28 weeks gestation of pregnancy: Secondary | ICD-10-CM

## 2016-11-29 DIAGNOSIS — Z3490 Encounter for supervision of normal pregnancy, unspecified, unspecified trimester: Secondary | ICD-10-CM

## 2016-11-29 DIAGNOSIS — Z113 Encounter for screening for infections with a predominantly sexual mode of transmission: Secondary | ICD-10-CM

## 2016-11-29 LAB — HPV APTIMA
HPV APTIMA: POSITIVE
HPV Aptima: NEGATIVE

## 2016-11-29 NOTE — Patient Instructions (Signed)
Glucose Tolerance Test The glucose tolerance test (GTT) is one of several tests used to diagnose diabetes mellitus. The GTT is a blood test, and it may include a urine test as well. The GTT checks to see how your body processes sugar (glucose). For this test, you will consume a drink containing a high level of glucose. Your blood glucose levels will be checked before you consume the drink and then again 1, 2, 3, and possibly 4 hours after you consume it. Your health care provider may recommend that you have the GTT if you:  Have a family history of diabetes.  Are very overweight (obese).  Have experienced infections that keep coming back.  Have had numerous cuts or wounds that did not heal quickly, especially on your legs and feet.  Are a woman and have a history of giving birth to very large babies or a history of repeated fetal loss (stillbirth).  Have had glucose in your urine or high blood sugar: ? During pregnancy. ? After a heart attack, surgery, or prolonged periods of high stress.  The GTT lasts 3-4 hours. Other than the glucose solution, you will not be allowed to eat or drink anything during the test. You must remain at the testing location to make sure that your blood and urine samples are taken on time. How do I prepare for this test? Eat normally for 3 days prior to the GTT test, including having plenty of carbohydrate-rich foods. Do not eat or drink anything except water during the final 12 hours before the test. You should not smoke or exercise during the test. In addition, your health care provider may ask you to stop taking certain medicines before the test. What do the results mean? It is your responsibility to obtain your test results. Ask the lab or department performing the test when and how you will get your results. Contact your health care provider to discuss any questions you have about your results. Range of Normal Values Ranges for normal values may vary among  different labs and hospitals. You should always check with your health care provider after having lab work or other tests done to discuss whether your values are considered within normal limits. Normal levels of blood glucose are as follows:  Fasting: less than 110 mg/dL or less than 6.1 mmol/L (SI units).  1 hour after consuming the glucose drink: less than 200 mg/dL or less than 11.1 mmol/L.  2 hours after consuming the glucose drink: less than 140 mg/dL or less than 7.8 mmol/L.  3 hours after consuming the glucose drink: 70-115 mg/dL or less than 6.4 mmol/L.  4 hours after consuming the glucose drink: 70-115 mg/dL or less than 6.4 mmol/L.  The normal result for the urine test is negative, meaning that glucose is absent from your urine. Some substances can interfere with GTT results. These may include:  Blood pressure and heart failure medicines, including beta blockers, furosemide, and thiazides.  Anti-inflammatory medicines, including aspirin.  Nicotine.  Some psychiatric medicines.  Oral contraceptives.  Diuretics or corticosteroids.  Meaning of Results Outside Normal Value Ranges GTT test results that are above normal values may indicate health problems, such as:  Diabetes mellitus.  Acute stress response.  Cushing syndrome.  Tumors such as pheochromocytoma or glucagonoma.  Chronic renal failure.  Pancreatitis.  Hyperthyroidism.  Current infection.  Discuss your test results with your health care provider. He or she will use the results to make a diagnosis and determine a treatment plan   that is right for you. Talk with your health care provider to discuss your results, treatment options, and if necessary, the need for more tests. Talk with your health care provider if you have any questions about your results. This information is not intended to replace advice given to you by your health care provider. Make sure you discuss any questions you have with your  health care provider. Document Released: 10/03/2004 Document Revised: 05/15/2016 Document Reviewed: 01/15/2014 Elsevier Interactive Patient Education  2017 [employer]  

## 2016-11-29 NOTE — Progress Notes (Signed)
H/o alcohol dependance, seen DP this pregnancy w US/counseling H/o cong septal defect, seen DP for this as well No c/o today PNV, [facility], [place] Denies alcohol use

## 2016-11-30 LAB — 28 WEEK RH+PANEL
BASOS ABS: 0 10*3/uL (ref 0.0–0.2)
Basos: 0 %
EOS (ABSOLUTE): 0.2 10*3/uL (ref 0.0–0.4)
Eos: 2 %
GESTATIONAL DIABETES SCREEN: 80 mg/dL (ref 65–139)
HEMOGLOBIN: 12.3 g/dL (ref 11.1–15.9)
HIV Screen 4th Generation wRfx: NONREACTIVE
Hematocrit: 36.8 % (ref 34.0–46.6)
Immature Grans (Abs): 0 10*3/uL (ref 0.0–0.1)
Immature Granulocytes: 0 %
Lymphocytes Absolute: 1.6 10*3/uL (ref 0.7–3.1)
Lymphs: 18 %
MCH: 32.5 pg (ref 26.6–33.0)
MCHC: 33.4 g/dL (ref 31.5–35.7)
MCV: 97 fL (ref 79–97)
Monocytes Absolute: 0.5 10*3/uL (ref 0.1–0.9)
Monocytes: 6 %
NEUTROS ABS: 6.7 10*3/uL (ref 1.4–7.0)
Neutrophils: 74 %
PLATELETS: 209 10*3/uL (ref 150–379)
RBC: 3.78 x10E6/uL (ref 3.77–5.28)
RDW: 13.2 % (ref 12.3–15.4)
RPR: NONREACTIVE
WBC: 9.1 10*3/uL (ref 3.4–10.8)

## 2016-12-14 ENCOUNTER — Ambulatory Visit (INDEPENDENT_AMBULATORY_CARE_PROVIDER_SITE_OTHER): Payer: Medicaid Other | Admitting: Obstetrics and Gynecology

## 2016-12-14 VITALS — BP 118/74 | Wt 151.0 lb

## 2016-12-14 DIAGNOSIS — O0993 Supervision of high risk pregnancy, unspecified, third trimester: Secondary | ICD-10-CM

## 2016-12-14 DIAGNOSIS — Z3A30 30 weeks gestation of pregnancy: Secondary | ICD-10-CM

## 2016-12-14 DIAGNOSIS — Z23 Encounter for immunization: Secondary | ICD-10-CM

## 2016-12-14 NOTE — Progress Notes (Signed)
No vb. No lof. No other issues

## 2016-12-17 ENCOUNTER — Telehealth: Payer: Self-pay

## 2016-12-17 NOTE — Telephone Encounter (Signed)
FMLA/DISABILITY form for [employer] filled out and given to [place] for processing.

## 2016-12-28 ENCOUNTER — Encounter: Payer: Medicaid Other | Admitting: Certified Nurse Midwife

## 2017-01-02 ENCOUNTER — Ambulatory Visit (INDEPENDENT_AMBULATORY_CARE_PROVIDER_SITE_OTHER): Payer: Medicaid Other | Admitting: Obstetrics and Gynecology

## 2017-01-02 VITALS — BP 116/72 | Wt 151.0 lb

## 2017-01-02 DIAGNOSIS — O0990 Supervision of high risk pregnancy, unspecified, unspecified trimester: Secondary | ICD-10-CM

## 2017-01-02 DIAGNOSIS — Z3A33 33 weeks gestation of pregnancy: Secondary | ICD-10-CM

## 2017-01-02 DIAGNOSIS — F1020 Alcohol dependence, uncomplicated: Secondary | ICD-10-CM

## 2017-01-02 NOTE — Progress Notes (Signed)
No vb. No lof.  

## 2017-01-18 ENCOUNTER — Ambulatory Visit (INDEPENDENT_AMBULATORY_CARE_PROVIDER_SITE_OTHER): Payer: Medicaid Other | Admitting: Obstetrics and Gynecology

## 2017-01-18 VITALS — BP 112/66 | Wt 154.0 lb

## 2017-01-18 DIAGNOSIS — Z3403 Encounter for supervision of normal first pregnancy, third trimester: Secondary | ICD-10-CM

## 2017-01-18 DIAGNOSIS — Z3A35 35 weeks gestation of pregnancy: Secondary | ICD-10-CM

## 2017-01-18 NOTE — Patient Instructions (Signed)
Third Trimester of Pregnancy The third trimester is from week 28 through week 40 (months 7 through 9). The third trimester is a time when the unborn baby (fetus) is growing rapidly. At the end of the ninth month, the fetus is about 20 inches in length and weighs 6-10 pounds. Body changes during your third trimester Your body will continue to go through many changes during pregnancy. The changes vary from woman to woman. During the third trimester:  Your weight will continue to increase. You can expect to gain 25-35 pounds (11-16 kg) by the end of the pregnancy.  You may begin to get stretch marks on your hips, abdomen, and breasts.  You may urinate more often because the fetus is moving lower into your pelvis and pressing on your bladder.  You may develop or continue to have heartburn. This is caused by increased hormones that slow down muscles in the digestive tract.  You may develop or continue to have constipation because increased hormones slow digestion and cause the muscles that push waste through your intestines to relax.  You may develop hemorrhoids. These are swollen veins (varicose veins) in the rectum that can itch or be painful.  You may develop swollen, bulging veins (varicose veins) in your legs.  You may have increased body aches in the pelvis, back, or thighs. This is due to weight gain and increased hormones that are relaxing your joints.  You may have changes in your hair. These can include thickening of your hair, rapid growth, and changes in texture. Some women also have hair loss during or after pregnancy, or hair that feels dry or thin. Your hair will most likely return to normal after your baby is born.  Your breasts will continue to grow and they will continue to become tender. A yellow fluid (colostrum) may leak from your breasts. This is the first milk you are producing for your baby.  Your belly button may stick out.  You may notice more swelling in your hands,  face, or ankles.  You may have increased tingling or numbness in your hands, arms, and legs. The skin on your belly may also feel numb.  You may feel short of breath because of your expanding uterus.  You may have more problems sleeping. This can be caused by the size of your belly, increased need to urinate, and an increase in your body's metabolism.  You may notice the fetus "dropping," or moving lower in your abdomen (lightening).  You may have increased vaginal discharge.  You may notice your joints feel loose and you may have pain around your pelvic bone.  What to expect at prenatal visits You will have prenatal exams every 2 weeks until week 36. Then you will have weekly prenatal exams. During a routine prenatal visit:  You will be weighed to make sure you and the baby are growing normally.  Your blood pressure will be taken.  Your abdomen will be measured to track your baby's growth.  The fetal heartbeat will be listened to.  Any test results from the previous visit will be discussed.  You may have a cervical check near your due date to see if your cervix has softened or thinned (effaced).  You will be tested for Group B streptococcus. This happens between 35 and 37 weeks.  Your health care provider may ask you:  What your birth plan is.  How you are feeling.  If you are feeling the baby move.  If you have had   any abnormal symptoms, such as leaking fluid, bleeding, severe headaches, or abdominal cramping.  If you are using any tobacco products, including cigarettes, chewing tobacco, and electronic cigarettes.  If you have any questions.  Other tests or screenings that may be performed during your third trimester include:  Blood tests that check for low iron levels (anemia).  Fetal testing to check the health, activity level, and growth of the fetus. Testing is done if you have certain medical conditions or if there are problems during the  pregnancy.  Nonstress test (NST). This test checks the health of your baby to make sure there are no signs of problems, such as the baby not getting enough oxygen. During this test, a belt is placed around your belly. The baby is made to move, and its heart rate is monitored during movement.  What is false labor? False labor is a condition in which you feel small, irregular tightenings of the muscles in the womb (contractions) that usually go away with rest, changing position, or drinking water. These are called Braxton Hicks contractions. Contractions may last for hours, days, or even weeks before true labor sets in. If contractions come at regular intervals, become more frequent, increase in intensity, or become painful, you should see your health care provider. What are the signs of labor?  Abdominal cramps.  Regular contractions that start at 10 minutes apart and become stronger and more frequent with time.  Contractions that start on the top of the uterus and spread down to the lower abdomen and back.  Increased pelvic pressure and dull back pain.  A watery or bloody mucus discharge that comes from the vagina.  Leaking of amniotic fluid. This is also known as your "water breaking." It could be a slow trickle or a gush. Let your health care provider know if it has a color or strange odor. If you have any of these signs, call your health care provider right away, even if it is before your due date. Follow these instructions at home: Medicines  Follow your health care provider's instructions regarding medicine use. Specific medicines may be either safe or unsafe to take during pregnancy.  Take a prenatal vitamin that contains at least 600 micrograms (mcg) of folic acid.  If you develop constipation, try taking a stool softener if your health care provider approves. Eating and drinking  Eat a balanced diet that includes fresh fruits and vegetables, whole grains, good sources of protein  such as meat, eggs, or tofu, and low-fat dairy. Your health care provider will help you determine the amount of weight gain that is right for you.  Avoid raw meat and uncooked cheese. These carry germs that can cause birth defects in the baby.  If you have low calcium intake from food, talk to your health care provider about whether you should take a daily calcium supplement.  Eat four or five small meals rather than three large meals a day.  Limit foods that are high in fat and processed sugars, such as fried and sweet foods.  To prevent constipation: ? Drink enough fluid to keep your urine clear or pale yellow. ? Eat foods that are high in fiber, such as fresh fruits and vegetables, whole grains, and beans. Activity  Exercise only as directed by your health care provider. Most women can continue their usual exercise routine during pregnancy. Try to exercise for 30 minutes at least 5 days a week. Stop exercising if you experience uterine contractions.  Avoid heavy   lifting.  Do not exercise in extreme heat or humidity, or at high altitudes.  Wear low-heel, comfortable shoes.  Practice good posture.  You may continue to have sex unless your health care provider tells you otherwise. Relieving pain and discomfort  Take frequent breaks and rest with your legs elevated if you have leg cramps or low back pain.  Take warm sitz baths to soothe any pain or discomfort caused by hemorrhoids. Use hemorrhoid cream if your health care provider approves.  Wear a good support bra to prevent discomfort from breast tenderness.  If you develop varicose veins: ? Wear support pantyhose or compression stockings as told by your healthcare provider. ? Elevate your feet for 15 minutes, 3-4 times a day. Prenatal care  Write down your questions. Take them to your prenatal visits.  Keep all your prenatal visits as told by your health care provider. This is important. Safety  Wear your seat belt at  all times when driving.  Make a list of emergency phone numbers, including numbers for family, friends, the hospital, and police and fire departments. General instructions  Avoid cat litter boxes and soil used by cats. These carry germs that can cause birth defects in the baby. If you have a cat, ask someone to clean the litter box for you.  Do not travel far distances unless it is absolutely necessary and only with the approval of your health care provider.  Do not use hot tubs, steam rooms, or saunas.  Do not drink alcohol.  Do not use any products that contain nicotine or tobacco, such as cigarettes and e-cigarettes. If you need help quitting, ask your health care provider.  Do not use any medicinal herbs or unprescribed drugs. These chemicals affect the formation and growth of the baby.  Do not douche or use tampons or scented sanitary pads.  Do not cross your legs for long periods of time.  To prepare for the arrival of your baby: ? Take prenatal classes to understand, practice, and ask questions about labor and delivery. ? Make a trial run to the hospital. ? Visit the hospital and tour the maternity area. ? Arrange for maternity or paternity leave through employers. ? Arrange for family and friends to take care of pets while you are in the hospital. ? Purchase a rear-facing car seat and make sure you know how to install it in your car. ? Pack your hospital bag. ? Prepare the baby's nursery. Make sure to remove all pillows and stuffed animals from the baby's crib to prevent suffocation.  Visit your dentist if you have not gone during your pregnancy. Use a soft toothbrush to brush your teeth and be gentle when you floss. Contact a health care provider if:  You are unsure if you are in labor or if your water has broken.  You become dizzy.  You have mild pelvic cramps, pelvic pressure, or nagging pain in your abdominal area.  You have lower back pain.  You have persistent  nausea, vomiting, or diarrhea.  You have an unusual or bad smelling vaginal discharge.  You have pain when you urinate. Get help right away if:  Your water breaks before 37 weeks.  You have regular contractions less than 5 minutes apart before 37 weeks.  You have a fever.  You are leaking fluid from your vagina.  You have spotting or bleeding from your vagina.  You have severe abdominal pain or cramping.  You have rapid weight loss or weight gain.    You have shortness of breath with chest pain.  You notice sudden or extreme swelling of your face, hands, ankles, feet, or legs.  Your baby makes fewer than 10 movements in 2 hours.  You have severe headaches that do not go away when you take medicine.  You have vision changes. Summary  The third trimester is from week 28 through week 40, months 7 through 9. The third trimester is a time when the unborn baby (fetus) is growing rapidly.  During the third trimester, your discomfort may increase as you and your baby continue to gain weight. You may have abdominal, leg, and back pain, sleeping problems, and an increased need to urinate.  During the third trimester your breasts will keep growing and they will continue to become tender. A yellow fluid (colostrum) may leak from your breasts. This is the first milk you are producing for your baby.  False labor is a condition in which you feel small, irregular tightenings of the muscles in the womb (contractions) that eventually go away. These are called Braxton Hicks contractions. Contractions may last for hours, days, or even weeks before true labor sets in.  Signs of labor can include: abdominal cramps; regular contractions that start at 10 minutes apart and become stronger and more frequent with time; watery or bloody mucus discharge that comes from the vagina; increased pelvic pressure and dull back pain; and leaking of amniotic fluid. This information is not intended to replace advice  given to you by your health care provider. Make sure you discuss any questions you have with your health care provider. Document Released: 09/04/2001 Document Revised: 02/16/2016 Document Reviewed: 11/11/2012 Elsevier Interactive Patient Education  2017 [employer]  

## 2017-01-18 NOTE — Progress Notes (Signed)
Discharge/cramping/low back pain

## 2017-01-25 ENCOUNTER — Inpatient Hospital Stay: Payer: Medicaid Other | Admitting: Anesthesiology

## 2017-01-25 ENCOUNTER — Encounter
Admission: EM | Disposition: A | Discharge status: Home / Self Care | Payer: Self-pay | Source: Home / Self Care | Attending: Obstetrics and Gynecology

## 2017-01-25 ENCOUNTER — Ambulatory Visit (INDEPENDENT_AMBULATORY_CARE_PROVIDER_SITE_OTHER): Payer: Medicaid Other | Admitting: Obstetrics & Gynecology

## 2017-01-25 ENCOUNTER — Inpatient Hospital Stay
Admission: EM | Admit: 2017-01-25 | Discharge: 2017-01-27 | DRG: 765 | Disposition: A | Discharge status: Home / Self Care | Payer: Medicaid Other | Attending: Obstetrics and Gynecology | Admitting: Obstetrics and Gynecology

## 2017-01-25 VITALS — BP 110/68 | Wt 152.0 lb

## 2017-01-25 DIAGNOSIS — F1020 Alcohol dependence, uncomplicated: Secondary | ICD-10-CM

## 2017-01-25 DIAGNOSIS — O0990 Supervision of high risk pregnancy, unspecified, unspecified trimester: Secondary | ICD-10-CM

## 2017-01-25 DIAGNOSIS — O134 Gestational [pregnancy-induced] hypertension without significant proteinuria, complicating childbirth: Secondary | ICD-10-CM | POA: Diagnosis present

## 2017-01-25 DIAGNOSIS — O4290 Premature rupture of membranes, unspecified as to length of time between rupture and onset of labor, unspecified weeks of gestation: Secondary | ICD-10-CM | POA: Diagnosis present

## 2017-01-25 DIAGNOSIS — Z3A36 36 weeks gestation of pregnancy: Secondary | ICD-10-CM

## 2017-01-25 DIAGNOSIS — O4292 Full-term premature rupture of membranes, unspecified as to length of time between rupture and onset of labor: Principal | ICD-10-CM | POA: Diagnosis present

## 2017-01-25 LAB — COMPREHENSIVE METABOLIC PANEL
ALBUMIN: 3.5 g/dL (ref 3.5–5.0)
ALK PHOS: 101 U/L (ref 38–126)
ALT: 24 U/L (ref 14–54)
AST: 28 U/L (ref 15–41)
Anion gap: 6 (ref 5–15)
BUN: 11 mg/dL (ref 6–20)
CHLORIDE: 105 mmol/L (ref 101–111)
CO2: 24 mmol/L (ref 22–32)
CREATININE: 0.67 mg/dL (ref 0.44–1.00)
Calcium: 9.1 mg/dL (ref 8.9–10.3)
GFR calc Af Amer: >60 mL/min (ref >60)
GFR calc non Af Amer: >60 mL/min (ref >60)
GLUCOSE: 79 mg/dL (ref 65–99)
Potassium: 3.8 mmol/L (ref 3.5–5.1)
SODIUM: 135 mmol/L (ref 135–145)
Total Bilirubin: 0.6 mg/dL (ref 0.3–1.2)
Total Protein: 6.2 g/dL — ABNORMAL LOW (ref 6.5–8.1)

## 2017-01-25 LAB — CBC
HEMATOCRIT: 37.8 % (ref 35.0–47.0)
Hemoglobin: 13.2 g/dL (ref 12.0–16.0)
MCH: 32.7 pg (ref 26.0–34.0)
MCHC: 34.9 g/dL (ref 32.0–36.0)
MCV: 93.7 fL (ref 80.0–100.0)
PLATELETS: 227 10*3/uL (ref 150–440)
RBC: 4.04 MIL/uL (ref 3.80–5.20)
RDW: 12.8 % (ref 11.5–14.5)
WBC: 10.8 10*3/uL (ref 3.6–11.0)

## 2017-01-25 LAB — PROTEIN / CREATININE RATIO, URINE
Creatinine, Urine: 81 mg/dL
PROTEIN CREATININE RATIO: 0.17 mg/mg{creat} — AB (ref 0.00–0.15)
Total Protein, Urine: 14 mg/dL

## 2017-01-25 LAB — TYPE AND SCREEN
ABO/RH(D): O POS
Antibody Screen: NEGATIVE

## 2017-01-25 SURGERY — Surgical Case
Anesthesia: Epidural | Wound class: Clean Contaminated

## 2017-01-25 MED ORDER — FENTANYL 2.5 MCG/ML W/ROPIVACAINE 0.2% IN NS 100 ML EPIDURAL INFUSION (ARMC-ANES)
EPIDURAL | Status: AC
  Filled 2017-01-25: qty 100

## 2017-01-25 MED ORDER — NALBUPHINE HCL 10 MG/ML IJ SOLN: 5.0000 mg | Freq: Once | INTRAMUSCULAR | Status: DC | PRN

## 2017-01-25 MED ORDER — CEFAZOLIN SODIUM-DEXTROSE 1-4 GM/50ML-% IV SOLN
1.0000 g | INTRAVENOUS | Status: DC
  Filled 2017-01-25: qty 50

## 2017-01-25 MED ORDER — ACETAMINOPHEN 325 MG PO TABS: 650.0000 mg | ORAL_TABLET | ORAL | Status: DC | PRN

## 2017-01-25 MED ORDER — OXYTOCIN BOLUS FROM INFUSION: 500.0000 mL | Freq: Once | INTRAVENOUS | Status: DC

## 2017-01-25 MED ORDER — LIDOCAINE HCL (PF) 1 % IJ SOLN
INTRAMUSCULAR | Status: DC | PRN
  Administered 2017-01-25: 2 mL via SUBCUTANEOUS

## 2017-01-25 MED ORDER — FENTANYL 2.5 MCG/ML W/ROPIVACAINE 0.2% IN NS 100 ML EPIDURAL INFUSION (ARMC-ANES): 10.0000 mL/h | EPIDURAL | Status: DC

## 2017-01-25 MED ORDER — FENTANYL CITRATE (PF) 100 MCG/2ML IJ SOLN
INTRAMUSCULAR | Status: AC
Start: 2017-01-25 — End: 2017-01-25
  Filled 2017-01-25: qty 2

## 2017-01-25 MED ORDER — KETOROLAC TROMETHAMINE 30 MG/ML IJ SOLN
30.0000 mg | Freq: Four times a day (QID) | INTRAMUSCULAR | Status: AC | PRN
  Administered 2017-01-26: 30 mg via INTRAVENOUS
  Filled 2017-01-25: qty 1

## 2017-01-25 MED ORDER — MORPHINE SULFATE (PF) 0.5 MG/ML IJ SOLN
INTRAMUSCULAR | Status: AC
  Filled 2017-01-25: qty 10

## 2017-01-25 MED ORDER — SOD CITRATE-CITRIC ACID 500-334 MG/5ML PO SOLN
ORAL | Status: AC
  Filled 2017-01-25: qty 15

## 2017-01-25 MED ORDER — SOD CITRATE-CITRIC ACID 500-334 MG/5ML PO SOLN: 30.0000 mL | ORAL | Status: DC | PRN

## 2017-01-25 MED ORDER — LACTATED RINGERS IV SOLN: 500.0000 mL | INTRAVENOUS | Status: DC | PRN

## 2017-01-25 MED ORDER — BUTORPHANOL TARTRATE 2 MG/ML IJ SOLN: 1.0000 mg | INTRAMUSCULAR | Status: DC | PRN

## 2017-01-25 MED ORDER — LIDOCAINE HCL (PF) 1 % IJ SOLN: 30.0000 mL | INTRAMUSCULAR | Status: DC | PRN

## 2017-01-25 MED ORDER — LACTATED RINGERS IV SOLN
INTRAVENOUS | Status: DC
  Administered 2017-01-25: 16:00:00 via INTRAVENOUS

## 2017-01-25 MED ORDER — CEFAZOLIN (ANCEF) 1 G IV SOLR
1.0000 g | INTRAVENOUS | Status: DC
  Filled 2017-01-25: qty 1

## 2017-01-25 MED ORDER — TERBUTALINE SULFATE 1 MG/ML IJ SOLN: 0.2500 mg | Freq: Once | INTRAMUSCULAR | Status: DC | PRN

## 2017-01-25 MED ORDER — FENTANYL CITRATE (PF) 100 MCG/2ML IJ SOLN: 25.0000 ug | INTRAMUSCULAR | Status: DC | PRN

## 2017-01-25 MED ORDER — NALOXONE HCL 0.4 MG/ML IJ SOLN: 0.4000 mg | INTRAMUSCULAR | Status: DC | PRN

## 2017-01-25 MED ORDER — OXYTOCIN 40 UNITS IN LACTATED RINGERS INFUSION - SIMPLE MED
2.5000 [IU]/h | INTRAVENOUS | Status: DC
  Administered 2017-01-25: 600 mL via INTRAVENOUS

## 2017-01-25 MED ORDER — BUPIVACAINE HCL (PF) 0.5 % IJ SOLN
INTRAMUSCULAR | Status: DC | PRN
  Administered 2017-01-25: 10 mL

## 2017-01-25 MED ORDER — LIDOCAINE-EPINEPHRINE (PF) 1.5 %-1:200000 IJ SOLN
INTRAMUSCULAR | Status: DC | PRN
  Administered 2017-01-25: 3 mL via EPIDURAL

## 2017-01-25 MED ORDER — ONDANSETRON HCL 4 MG/2ML IJ SOLN: 4.0000 mg | Freq: Three times a day (TID) | INTRAMUSCULAR | Status: DC | PRN

## 2017-01-25 MED ORDER — SOD CITRATE-CITRIC ACID 500-334 MG/5ML PO SOLN: 30.0000 mL | ORAL | Status: DC

## 2017-01-25 MED ORDER — FENTANYL CITRATE (PF) 100 MCG/2ML IJ SOLN
INTRAMUSCULAR | Status: DC | PRN
  Administered 2017-01-25: 15 ug via INTRATHECAL

## 2017-01-25 MED ORDER — DIPHENHYDRAMINE HCL 25 MG PO CAPS
25.0000 mg | ORAL_CAPSULE | ORAL | Status: DC | PRN
Start: 2017-01-25 — End: 2017-01-27

## 2017-01-25 MED ORDER — ONDANSETRON HCL 4 MG/2ML IJ SOLN: 4.0000 mg | Freq: Four times a day (QID) | INTRAMUSCULAR | Status: DC | PRN

## 2017-01-25 MED ORDER — ONDANSETRON HCL 4 MG/2ML IJ SOLN: 4.0000 mg | Freq: Once | INTRAMUSCULAR | Status: DC | PRN

## 2017-01-25 MED ORDER — OXYTOCIN 40 UNITS IN LACTATED RINGERS INFUSION - SIMPLE MED
1.0000 m[IU]/min | INTRAVENOUS | Status: DC
  Filled 2017-01-25: qty 1000

## 2017-01-25 MED ORDER — EPHEDRINE SULFATE 50 MG/ML IJ SOLN
INTRAMUSCULAR | Status: DC | PRN
  Administered 2017-01-25 (×2): 10 mg via INTRAVENOUS

## 2017-01-25 MED ORDER — MORPHINE SULFATE (PF) 0.5 MG/ML IJ SOLN
INTRAMUSCULAR | Status: DC | PRN
  Administered 2017-01-25: .1 mg via INTRATHECAL

## 2017-01-25 MED ORDER — DIPHENHYDRAMINE HCL 50 MG/ML IJ SOLN: 12.5000 mg | INTRAMUSCULAR | Status: DC | PRN

## 2017-01-25 MED ORDER — BUPIVACAINE HCL 0.5 % IJ SOLN
50.0000 mL | Freq: Once | INTRAMUSCULAR | Status: DC
  Filled 2017-01-25: qty 50

## 2017-01-25 MED ORDER — BUPIVACAINE HCL (PF) 0.5 % IJ SOLN
INTRAMUSCULAR | Status: AC
  Filled 2017-01-25: qty 30

## 2017-01-25 MED ORDER — PENICILLIN G POT IN DEXTROSE 60000 UNIT/ML IV SOLN
3.0000 10*6.[IU] | INTRAVENOUS | Status: DC
  Administered 2017-01-25: 3 10*6.[IU] via INTRAVENOUS
  Filled 2017-01-25 (×7): qty 50

## 2017-01-25 MED ORDER — PENICILLIN G POTASSIUM 5000000 UNITS IJ SOLR
5.0000 10*6.[IU] | Freq: Once | INTRAVENOUS | Status: AC
  Administered 2017-01-25: 5 10*6.[IU] via INTRAVENOUS
  Filled 2017-01-25: qty 5

## 2017-01-25 MED ORDER — SODIUM CHLORIDE 0.9 % IV SOLN
INTRAVENOUS | Status: DC | PRN
  Administered 2017-01-25 (×2): 5 mL via EPIDURAL

## 2017-01-25 MED ORDER — BUPIVACAINE 0.25 % ON-Q PUMP DUAL CATH 400 ML
400.0000 mL | INJECTION | Status: DC
  Filled 2017-01-25: qty 400

## 2017-01-25 MED ORDER — SODIUM CHLORIDE 0.9% FLUSH: 3.0000 mL | INTRAVENOUS | Status: DC | PRN

## 2017-01-25 MED ORDER — KETOROLAC TROMETHAMINE 30 MG/ML IJ SOLN: 30.0000 mg | Freq: Four times a day (QID) | INTRAMUSCULAR | Status: AC | PRN

## 2017-01-25 MED ORDER — NALOXONE HCL 2 MG/2ML IJ SOSY
1.0000 ug/kg/h | PREFILLED_SYRINGE | INTRAVENOUS | Status: DC | PRN
  Filled 2017-01-25: qty 2

## 2017-01-25 MED ORDER — MEPERIDINE HCL 25 MG/ML IJ SOLN: 6.2500 mg | INTRAMUSCULAR | Status: DC | PRN

## 2017-01-25 MED ORDER — BUPIVACAINE IN DEXTROSE 0.75-8.25 % IT SOLN
INTRATHECAL | Status: DC | PRN
Start: 2017-01-25 — End: 2017-01-25
  Administered 2017-01-25: 1.4 mL via INTRATHECAL

## 2017-01-25 MED ORDER — NALBUPHINE HCL 10 MG/ML IJ SOLN
5.0000 mg | INTRAMUSCULAR | Status: DC | PRN
  Administered 2017-01-26: 5 mg via INTRAVENOUS
  Filled 2017-01-25: qty 1

## 2017-01-25 MED ORDER — NALBUPHINE HCL 10 MG/ML IJ SOLN: 5.0000 mg | INTRAMUSCULAR | Status: DC | PRN

## 2017-01-25 MED ORDER — FENTANYL 2.5 MCG/ML W/ROPIVACAINE 0.2% IN NS 100 ML EPIDURAL INFUSION (ARMC-ANES)
EPIDURAL | Status: DC | PRN
  Administered 2017-01-25: 10 mL/h via EPIDURAL

## 2017-01-25 SURGICAL SUPPLY — 28 items
BAG COUNTER SPONGE EZ (MISCELLANEOUS) ×4 IMPLANT
CANISTER SUCT 3000ML PPV (MISCELLANEOUS) ×3 IMPLANT
CATH KIT ON-Q SILVERSOAK 5IN (CATHETERS) ×6 IMPLANT
CHLORAPREP W/TINT 26ML (MISCELLANEOUS) ×6 IMPLANT
CLOSURE WOUND 1/2 X4 (GAUZE/BANDAGES/DRESSINGS) ×1
COUNTER SPONGE BAG EZ (MISCELLANEOUS) ×2
DERMABOND ADVANCED (GAUZE/BANDAGES/DRESSINGS) ×2
DERMABOND ADVANCED .7 DNX12 (GAUZE/BANDAGES/DRESSINGS) ×1 IMPLANT
DRSG OPSITE POSTOP 4X10 (GAUZE/BANDAGES/DRESSINGS) ×3 IMPLANT
DRSG TELFA 3X8 NADH (GAUZE/BANDAGES/DRESSINGS) IMPLANT
ELECT CAUTERY BLADE 6.4 (BLADE) ×3 IMPLANT
ELECT REM PT RETURN 9FT ADLT (ELECTROSURGICAL) ×3
ELECTRODE REM PT RTRN 9FT ADLT (ELECTROSURGICAL) ×1 IMPLANT
GAUZE SPONGE 4X4 12PLY STRL (GAUZE/BANDAGES/DRESSINGS) IMPLANT
GLOVE BIO SURGEON STRL SZ7 (GLOVE) ×9 IMPLANT
GLOVE INDICATOR 7.5 STRL GRN (GLOVE) ×3 IMPLANT
GOWN STRL REUS W/ TWL LRG LVL3 (GOWN DISPOSABLE) ×3 IMPLANT
GOWN STRL REUS W/TWL LRG LVL3 (GOWN DISPOSABLE) ×6
NS IRRIG 1000ML POUR BTL (IV SOLUTION) ×3 IMPLANT
PACK C SECTION AR (MISCELLANEOUS) ×3 IMPLANT
PAD OB MATERNITY 4.3X12.25 (PERSONAL CARE ITEMS) ×3 IMPLANT
PAD PREP 24X41 OB/GYN DISP (PERSONAL CARE ITEMS) ×3 IMPLANT
STRIP CLOSURE SKIN 1/2X4 (GAUZE/BANDAGES/DRESSINGS) ×2 IMPLANT
SUT MNCRL AB 4-0 PS2 18 (SUTURE) IMPLANT
SUT PDS AB 1 TP1 96 (SUTURE) ×3 IMPLANT
SUT VIC AB 0 CTX 36 (SUTURE) ×2
SUT VIC AB 0 CTX36XBRD ANBCTRL (SUTURE) ×1 IMPLANT
SUT VIC AB 2-0 CT1 36 (SUTURE) ×3 IMPLANT

## 2017-01-25 NOTE — Progress Notes (Signed)
Subjective:    Objective:   Vitals: Blood pressure (!) 141/96, pulse 61, temperature 98.1 F (36.7 C), temperature source Oral, resp. rate 20, height 5\' 3"  (1.6 m), weight 152 lb (68.9 kg), last menstrual period 05/09/2016. General:  Abdomen: Cervical Exam:  Dilation: 4 Effacement (%): 80 Station: 0 Presentation: Vertex Exam by:: Dr. Bonney AidStaebler  FHT: 140, moderate, +accels, 6 min decel.  Resolved with position changes Toco: q865min  Results for orders placed or performed during the hospital encounter of 01/25/17 (from the past 24 hour(s))  CBC     Status: None   Collection Time: 01/25/17  2:19 PM  Result Value Ref Range   WBC 10.8 3.6 - 11.0 K/uL   RBC 4.04 3.80 - 5.20 MIL/uL   Hemoglobin 13.2 12.0 - 16.0 g/dL   HCT 16.137.8 09.635.0 - 04.547.0 %   MCV 93.7 80.0 - 100.0 fL   MCH 32.7 26.0 - 34.0 pg   MCHC 34.9 32.0 - 36.0 g/dL   RDW 40.912.8 81.111.5 - 91.414.5 %   Platelets 227 150 - 440 K/uL  Type and screen [facility]     Status: None   Collection Time: 01/25/17  2:19 PM  Result Value Ref Range   ABO/RH(D) O POS    Antibody Screen NEG    Sample Expiration 01/28/2017   Protein / creatinine ratio, urine     Status: Abnormal   Collection Time: 01/25/17  2:19 PM  Result Value Ref Range   Creatinine, Urine 81 mg/dL   Total Protein, Urine 14 mg/dL   Protein Creatinine Ratio 0.17 (H) 0.00 - 0.15 mg/mg[Cre]  Comprehensive metabolic panel     Status: Abnormal   Collection Time: 01/25/17  2:19 PM  Result Value Ref Range   Sodium 135 135 - 145 mmol/L   Potassium 3.8 3.5 - 5.1 mmol/L   Chloride 105 101 - 111 mmol/L   CO2 24 22 - 32 mmol/L   Glucose, Bld 79 65 - 99 mg/dL   BUN 11 6 - 20 mg/dL   Creatinine, Ser 7.820.67 0.44 - 1.00 mg/dL   Calcium 9.1 8.9 - 95.610.3 mg/dL   Total Protein 6.2 (L) 6.5 - 8.1 g/dL   Albumin 3.5 3.5 - 5.0 g/dL   AST 28 15 - 41 U/L   ALT 24 14 - 54 U/L   Alkaline Phosphatase 101 38 - 126 U/L   Total Bilirubin 0.6 0.3 - 1.2 mg/dL   GFR calc non Af Amer  >60 >60 mL/min   GFR calc Af Amer >60 >60 mL/min   Anion gap 6 5 - 15    Assessment:   31 y.o. G1P0 374w3d PROM, GHTN  Plan:   1) Labor -contractions have spaced out will allow fetus recover then start pitocin  2) Fetus - cat II - moderate meconium noted at time of check - recovered nicely and previously cat I tracing throughout day  3) GHTN - mild range BP, normal labs

## 2017-01-25 NOTE — H&P (Signed)
Obstetric H&P   Chief Complaint: Leaking fluid  Prenatal Care Provider: [facility]  History of Present Illness: 31 y.o. G1P0 [redacted]w[redacted]d by LMP=9w [place] derived EDC of 02/19/2017, presenting to L&D with rupture of membranes. +FM, no VB, irregular contractions.  Was seen earlier today in clinci for 36 week visit with GBS collected at that time, noted to be 3cm dilated.  PNC thus far uncomplicated, other than history of depression, LGSIL pap HPV positive.    ABO, Rh: O/Positive/-- (10/11 0000)  Antibody: negative Rubella: Immune Varicella: Immune RPR: Non Reactive (03/08 0928)  HBsAg: Negative (10/11 0000)  HIV: Non Reactive (03/08 0928)  1st Trimester Screen: negative AFP: negative 1-hr: 80 GBS: unknown  Review of Systems: 10 point review of systems negative unless otherwise noted in HPI  Past Medical History: Past Medical History:  Diagnosis Date  . Atypical squamous cell changes of undetermined significance (ASCUS) on cervical cytology with negative high risk human papilloma virus (HPV) test result 11/17/2014  . Bulimia nervosa   . Bunion 2001; 2006; 2007   BILATERAL  . Chlamydia 11/17/2014  . Depression   . Heart murmur    SEPTAL DEFECT  . 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 COLPOPEXY  08/08/2016   benign endocervical tissue with focally decidualized stroma compatible with progestin effect  . FOOT SURGERY  2001; 2006; 2007   left and right foot for bunion; two on the right and one on the left - between ages 5 & 87.    Family History: Family History  Problem Relation Age of Onset  . Diabetes Father   . Alcoholism Paternal Aunt   . Alcoholism Maternal Grandfather   . Cancer Paternal Grandfather   . Breast cancer Neg Hx     Social History: Social History   Social History  . Marital status: Single    Spouse name: N/A  . Number of children: N/A  . Years of education: N/A    Occupational History  . Not on file.   Social History Main Topics  . Smoking status: Never Smoker  . Smokeless tobacco: Never Used  . Alcohol use Yes     Comment: stopped after pregnancy confirmed  . Drug use: No  . Sexual activity: Yes    Birth control/ protection: Condom, None   Other Topics Concern  . Not on file   Social History Narrative  . No narrative on file    Medications: Prior to Admission medications   Medication Sig Start Date End Date Taking? Authorizing Provider  Prenatal Vit-Fe Fumarate-FA (PRENATAL VITAMIN PO) Take by mouth.    Historical Provider, MD    Allergies: No Known Allergies  Physical Exam: Vitals: Last menstrual period 05/09/2016.  Urine Dip Protein: N/A  FHT: 145, moderate, +accels, no decels Toco: 2-11min  General: NAD HEENT: normocephalic, anicteric Pulmonary: No increased work of breathing Cardiovascular: RRR, distal pulses 2+ Abdomen: Gravid, non-tender Leopolds: Genitourinary: deferred grossly ruptured 3cm this morning Extremities: no edema, erythema, or tenderness Neurologic: Grossly intact Psychiatric: mood appropriate, affect full  Labs: No results found for this or any previous visit (from the past 24 hour(s)).  Assessment: 31 y.o. G1P0 [redacted]w[redacted]d by 02/19/2017, presenting with ruptured membranes  Plan: 1)31 y.o. is a G1P0 at [redacted]w[redacted]d with preterm prelabor rupture of membranes (PPROM) requiring inpatient admission.  Given gestational age >36 weeks will monitor contraction patter if continues to increase will  follow expectantly otherwise add pitocin ACOG Practice Bulletin 172 October 2016 "Premature Rupture of Membranes"  2) Fetus - cat I tracing - PCN for GBS unknown  3) Elevated BP - normotensive in clinic today, PIH labs sent  4) TDAP - needs  5) Disposition - pending delivery

## 2017-01-25 NOTE — Op Note (Signed)
Preoperative Diagnosis: 1) 31 y.o. G1P0101 at [redacted]w[redacted]d 2) PPROM 3) Fetal intolerance to labor 4) Meconium  Postoperative Diagnosis: 1) 31 y.o. G1P0101 at [redacted]w[redacted]d 2) PPROM 3) Fetal intolerance to labor 4) Meconium  Operation Performed: Primary low transverse C-section via pfannenstiel skin incision  Indication: Fetus with two prolonged deceleration and some late decelerations, moderate meconium  Anesthesia: Spinal  Primary Surgeon: Vena Austria, MD  Preoperative Antibiotics: 1g ancef  Estimated Blood Loss:  IV Fluids:  Urine Output::  Drains or Tubes: Foley to gravity drainage, ON-Q catheter system  Implants: none  Specimens Removed: none  Complications: none  Intraoperative Findings:  Normal tubes ovaries and uterus.  Delivery resulted in the birth of a liveborn female, APGAR (1 MIN): 6   APGAR (5 MINS): 8, weight 6lbs2oz  Patient Condition: stable  Procedure in Detail:  Patient was taken to the operating room were she was administered regional anesthesia.  She was positioned in the supine position, prepped and draped in the  Usual sterile fashion.  Prior to proceeding with the case a time out was performed and the level of anesthetic was checked and noted to be adequate.  Utilizing the scalpel a pfannenstiel skin incision was made 2cm above the pubic symphysis and carried down sharply to the the level of the rectus fascia.  The fascia was incised in the midline using the scalpel and then extended using mayo scissors.  The superior border of the rectus fascia was grasped with two Kocher clamps and the underlying rectus muscles were dissected of the fascia using blunt dissection.  The median raphae was incised using Mayo scissors.   The inferior border of the rectus fascia was dissected of the rectus muscles in a similar fashion.  The midline was identified, the peritoneum was entered bluntly and expanded using manual tractions.  The uterus was noted to be in  a none rotated position.  Next the bladder blade was placed retracting the bladder caudally.  A bladder flap was not created.  A low transverse incision was scored on the lower uterine segment.  The hysterotomy was entered bluntly using the operators finger.  The hysterotomy incision was extended using manual traction.  The operators hand was placed within the hysterotomy position noting the fetus to be within the OA position.  The vertex was grasped, flexed, brought to the incision, and delivered a traumatically using fundal pressure.  The remainder of the body delivered with ease.  The infant was suctioned, cord was clamped and cut before handing off to the awaiting neonatologist.  The placenta was delivered using manual extraction.  The uterus was exteriorized, wiped clean of clots and debris using two moist laps.  The hysterotomy was closed using a two layer closure of 0 Vicryl, with the first being a running locked, the second a vertical imbricating.  The uterus was returned to the abdomen.  The peritoneal gutters were wiped clean of clots and debris using two moist laps.  The hysterotomy incision was re-inspected noted to be hemostatic.  The rectus muscles were re-approximated in the midline using a single 2-0 Vicryl mattress stitch.  The rectus muscles were inspected noted to be hemostatic.  The superior border of the rectus fascia was grasped with a Kocher clamp.  The ON-Q trocars were then placed 4cm above the superior border of the incision and tunneled subfascially.  The introducers were removed and the catheters were threaded through the sleeves after which the sleeves were removed.  The fascia  was closed using a looped #1 PDS in a running fashion taking 1cm by 1cm bites.  The subcutaneous tissue was irrigated using warm saline, hemostasis achieved using the bovie.  The subcutaneous dead space was less than 3cm and was not closed.  The skin was closed using insorb staples.  Sponge needle and instrument  counts were corrects times two.  The patient tolerated the procedure well and was taken to the recovery room in stable condition.

## 2017-01-25 NOTE — Transfer of Care (Signed)
Immediate Anesthesia Transfer of Care Note  Patient: Monique Kaufman  Procedure(s) Performed: Procedure(s): CESAREAN SECTION (N/A)  Patient Location: L & D 3  Anesthesia Type:Spinal  Level of Consciousness: awake, alert  and oriented  Airway & Oxygen Therapy: Patient Spontanous Breathing  Post-op Assessment: Report given to RN  Post vital signs: Reviewed  Last Vitals:  Vitals:   01/25/17 1945 01/25/17 2304  BP: (!) 141/92 (!) 115/54  Pulse: 66   Resp:  20  Temp:  36.5 C    Last Pain:  Vitals:   01/25/17 2304  TempSrc: Oral  PainSc:          Complications: No apparent anesthesia complications

## 2017-01-25 NOTE — Progress Notes (Signed)
GBS/Aptima today PNV, FMC, Labor precautions

## 2017-01-25 NOTE — Discharge Summary (Signed)
OB Discharge Summary     Patient Name: Monique Kaufman DOB: [id number] MRN: [id number]  Date of admission: 01/25/2017 Delivering MD: Vena Austria   Date of discharge: 01/27/2017  Admitting diagnosis: Leaking Fluid 36 wks preg fetal decelerations Intrauterine pregnancy: [redacted]w[redacted]d     Secondary diagnosis:  Active Problems:   Premature rupture of membranes  Additional problems: Gestational hypertension     Discharge diagnosis: Term Pregnancy Delivered and Gestational Hypertension                                                                                                Post partum procedures:none  Augmentation: none  Complications: None  Hospital course:  Onset of Labor With Unplanned C/S  31 y.o. yo G1P0101 at [redacted]w[redacted]d was admitted in Latent Labor on 01/25/2017. Patient had a labor course significant for fetal intolerance to labor. Membrane Rupture Time/Date: 6:54 PM ,01/25/2017   The patient went for cesarean section due to Non-Reassuring FHR, and delivered a Viable infant,01/25/2017  Details of operation can be found in separate operative note. Patient had an uncomplicated postpartum course.  She is ambulating,tolerating a regular diet, passing flatus, and urinating well.  Patient is discharged home in stable condition 01/27/17.  Physical exam  Vitals:   01/26/17 1946 01/27/17 0022 01/27/17 0400 01/27/17 0803  BP: 118/79   122/83  Pulse: 65   61  Resp: 18   17  Temp: 97.7 F (36.5 C) 97.9 F (36.6 C) 97.9 F (36.6 C) 97.8 F (36.6 C)  TempSrc: Oral Oral Axillary Oral  SpO2: 100%   100%  Weight:      Height:       General: alert Lochia: appropriate Uterine Fundus: firm Incision: Healing well with no significant drainage DVT Evaluation: No evidence of DVT seen on physical exam. Labs: Lab Results  Component Value Date   WBC 15.5 (H) 01/26/2017   HGB 11.6 (L) 01/26/2017   HCT 34.1 (L) 01/26/2017   MCV 94.6 01/26/2017   PLT 184 01/26/2017   CMP Latest Ref Rng & Units  01/25/2017  Glucose 65 - 99 mg/dL 79  BUN 6 - 20 mg/dL 11  Creatinine 4.09 - 8.11 mg/dL 9.14  Sodium 782 - 956 mmol/L 135  Potassium 3.5 - 5.1 mmol/L 3.8  Chloride 101 - 111 mmol/L 105  CO2 22 - 32 mmol/L 24  Calcium 8.9 - 10.3 mg/dL 9.1  Total Protein 6.5 - 8.1 g/dL 6.2(L)  Total Bilirubin 0.3 - 1.2 mg/dL 0.6  Alkaline Phos 38 - 126 U/L 101  AST 15 - 41 U/L 28  ALT 14 - 54 U/L 24    Discharge instruction: per After Visit Summary and "Baby and Me Booklet".  After visit meds:  Allergies as of 01/27/2017   No Known Allergies     Medication List    TAKE these medications   oxyCODONE-acetaminophen 5-325 MG tablet Commonly known as:  PERCOCET/ROXICET Take 2 tablets by mouth every 4 (four) hours as needed (pain scale > 7).   PRENATAL VITAMIN PO Take by mouth.       Diet: No  evidence of DVT seen on physical exam.  Activity: Advance as tolerated. Pelvic rest for 6 weeks.   Outpatient follow up:1 week incision and BP check Follow up Appt: Future Appointments Date Time Provider Department Center  01/30/2017 9:30 AM Vena AustriaStaebler, Andreas, MD [facility] None   Follow up Visit:No Follow-up on file.  Postpartum contraception: Depo Provera  Newborn Data: Live born female  Birth Weight: 6 lb 1 oz (2750 g) APGAR: 6, 8  Baby Feeding: Breast Disposition:home with mother   01/27/2017 Monique Libraobert Paul Sache Sane, MD

## 2017-01-25 NOTE — Patient Instructions (Signed)

## 2017-01-25 NOTE — Anesthesia Preprocedure Evaluation (Addendum)
Anesthesia Evaluation  Patient identified by MRN, date of birth, ID band Patient awake    Reviewed: Allergy & Precautions, H&P , NPO status , Patient's Chart, lab work & pertinent test results, reviewed documented beta blocker date and time   History of Anesthesia Complications Negative for: history of anesthetic complications  Airway Mallampati: I  TM Distance: >3 FB Neck ROM: full    Dental  (+) Teeth Intact Permanent retainer:   Pulmonary neg pulmonary ROS,           Cardiovascular Exercise Tolerance: Good (-) angina(-) CAD, (-) Past MI, (-) Cardiac Stents and (-) CABG (-) dysrhythmias + Valvular Problems/Murmurs (PFO)      Neuro/Psych PSYCHIATRIC DISORDERS negative neurological ROS     GI/Hepatic negative GI ROS, Neg liver ROS,   Endo/Other  negative endocrine ROS  Renal/GU negative Renal ROS  negative genitourinary   Musculoskeletal   Abdominal   Peds  Hematology negative hematology ROS (+)   Anesthesia Other Findings Past Medical History: 11/17/2014: Atypical squamous cell changes of undetermined* No date: Bulimia nervosa 2001; 2006; 2007: Bunion     Comment: BILATERAL 11/17/2014: Chlamydia No date: Depression No date: Heart murmur     Comment: 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  Reproductive/Obstetrics (+) Pregnancy                            Anesthesia Physical Anesthesia Plan  ASA: II and emergent  Anesthesia Plan: Spinal   Post-op Pain Management:    Induction:   Airway Management Planned:   Additional Equipment:   Intra-op Plan:   Post-operative Plan:   Informed Consent: I have reviewed the patients History and Physical, chart, labs and discussed the procedure including the risks, benefits and  alternatives for the proposed anesthesia with the patient or authorized representative who has indicated his/her understanding and acceptance.   Dental Advisory Given  Plan Discussed with: Anesthesiologist, CRNA and Surgeon  Anesthesia Plan Comments:        Anesthesia Quick Evaluation

## 2017-01-25 NOTE — Progress Notes (Signed)
Subjective:  Comfortable with epidural still feeling some pressure  Objective:   Vitals: Blood pressure (!) 141/92, pulse 66, temperature 98 F (36.7 C), temperature source Oral, resp. rate 18, height 5\' 3"  (1.6 m), weight 152 lb (68.9 kg), last menstrual period 05/09/2016. General: NAD Abdomen: gravid, non-tender Cervical Exam:  Dilation: 4 Effacement (%): 80 Station: 0 Presentation: Vertex Exam by:: Dr. Bonney Aid  FHT: 140,moderate, +accels, second prolonged deceleration 7-8 minutes, with a late deceleration noted in the recovery period Toco: q82min  Results for orders placed or performed during the hospital encounter of 01/25/17 (from the past 24 hour(s))  CBC     Status: None   Collection Time: 01/25/17  2:19 PM  Result Value Ref Range   WBC 10.8 3.6 - 11.0 K/uL   RBC 4.04 3.80 - 5.20 MIL/uL   Hemoglobin 13.2 12.0 - 16.0 g/dL   HCT 13.0 86.5 - 78.4 %   MCV 93.7 80.0 - 100.0 fL   MCH 32.7 26.0 - 34.0 pg   MCHC 34.9 32.0 - 36.0 g/dL   RDW 69.6 29.5 - 28.4 %   Platelets 227 150 - 440 K/uL  Type and screen [facility]     Status: None   Collection Time: 01/25/17  2:19 PM  Result Value Ref Range   ABO/RH(D) O POS    Antibody Screen NEG    Sample Expiration 01/28/2017   Protein / creatinine ratio, urine     Status: Abnormal   Collection Time: 01/25/17  2:19 PM  Result Value Ref Range   Creatinine, Urine 81 mg/dL   Total Protein, Urine 14 mg/dL   Protein Creatinine Ratio 0.17 (H) 0.00 - 0.15 mg/mg[Cre]  Comprehensive metabolic panel     Status: Abnormal   Collection Time: 01/25/17  2:19 PM  Result Value Ref Range   Sodium 135 135 - 145 mmol/L   Potassium 3.8 3.5 - 5.1 mmol/L   Chloride 105 101 - 111 mmol/L   CO2 24 22 - 32 mmol/L   Glucose, Bld 79 65 - 99 mg/dL   BUN 11 6 - 20 mg/dL   Creatinine, Ser 1.32 0.44 - 1.00 mg/dL   Calcium 9.1 8.9 - 44.0 mg/dL   Total Protein 6.2 (L) 6.5 - 8.1 g/dL   Albumin 3.5 3.5 - 5.0 g/dL   AST 28 15 - 41 U/L   ALT 24 14 - 54 U/L   Alkaline Phosphatase 101 38 - 126 U/L   Total Bilirubin 0.6 0.3 - 1.2 mg/dL   GFR calc non Af Amer >60 >60 mL/min   GFR calc Af Amer >60 >60 mL/min   Anion gap 6 5 - 15    Assessment:   31 y.o. G1P0 [redacted]w[redacted]d PPROM, GHTN  Plan:   1) Labor - The patient was counseled regarding risk and benefits to proceeding with Cesarean section to expedite delivery.  Risk of cesarean section were discussed including risk of bleeding and need for potential intraoperative or postoperative blood transfusion with a rate of approximately 5% quoted for all Cesarean sections, risk of injury to adjacent organs including but not limited to bowl and bladder, the need for additional surgical procedures to address such injuries, and the risk of infection.  The risk of continued attempts at vaginal delivery include but are note limited to worsening fetal or maternal status.  After consideration of options the patient is amenable to proceed with primary cesarean section for delivery.   2) Fetus - patient with two prolonged  decelerations, late deceleration during recovery period of second decel, and moderate meconium.  Given these findings remote from delivery I recommended proceeding with 1LTCS for delivery.  Pitocin has not been started, did just get epidural but pressures are normotensive.

## 2017-01-25 NOTE — Progress Notes (Signed)
Pt requests epidural 1940 Dr Karlton LemonKarenz notified 2000 [facility] test dose Maternal HR 52 2027 Bolus 1  2032Bolus 2 2033 Drip started by MD

## 2017-01-25 NOTE — OB Triage Note (Signed)
patient presents to triage c/o water breaking at work. She states she saw dr Tiburcio Peaharris this morning in clinic and had GBS swab and was checked and was 3 cm.

## 2017-01-25 NOTE — Anesthesia Post-op Follow-up Note (Cosign Needed)
Anesthesia QCDR form completed.        

## 2017-01-25 NOTE — Anesthesia Procedure Notes (Signed)
Spinal  Patient location during procedure: OR Start time: 01/25/2017 10:10 PM End time: 01/25/2017 10:12 PM Staffing Anesthesiologist: Martha Clan Performed: anesthesiologist  Preanesthetic Checklist Completed: patient identified, site marked, surgical consent, pre-op evaluation, timeout performed, IV checked, risks and benefits discussed and monitors and equipment checked Spinal Block Patient position: sitting Prep: ChloraPrep Patient monitoring: heart rate, continuous pulse ox, blood pressure and cardiac monitor Approach: midline Location: L3-4 Injection technique: single-shot Needle Needle type: Whitacre and Introducer  Needle gauge: 24 G Needle length: 9 cm Assessment Sensory level: T10 Additional Notes Negative paresthesia. Negative blood return. Positive free-flowing CSF. Expiration date of kit checked and confirmed. Patient tolerated procedure well, without complications.

## 2017-01-25 NOTE — Anesthesia Procedure Notes (Signed)
Epidural Patient location during procedure: OB Start time: 01/25/2017 8:18 PM End time: 01/25/2017 8:25 PM  Staffing Anesthesiologist: Lenard SimmerKARENZ, Jermone Geister Performed: anesthesiologist   Preanesthetic Checklist Completed: patient identified, site marked, surgical consent, pre-op evaluation, timeout performed, IV checked, risks and benefits discussed and monitors and equipment checked  Epidural Patient position: sitting Prep: ChloraPrep Patient monitoring: heart rate, continuous pulse ox and blood pressure Approach: midline Location: L4-L5 Injection technique: LOR saline  Needle:  Needle type: Tuohy  Needle gauge: 17 G Needle length: 9 cm and 9 Needle insertion depth: 4 cm Catheter type: closed end flexible Catheter size: 19 Gauge Catheter at skin depth: 8 cm Test dose: negative and 1.5% lidocaine with Epi 1:200 K  Assessment Sensory level: T10 Events: blood not aspirated, injection not painful, no injection resistance, negative IV test and no paresthesia  Additional Notes Pt. Evaluated and documentation done after procedure finished. Patient identified. Risks/Benefits/Options discussed with patient including but not limited to bleeding, infection, nerve damage, paralysis, failed block, incomplete pain control, headache, blood pressure changes, nausea, vomiting, reactions to medication both or allergic, itching and postpartum back pain. Confirmed with bedside nurse the patient's most recent platelet count. Confirmed with patient that they are not currently taking any anticoagulation, have any bleeding history or any family history of bleeding disorders. Patient expressed understanding and wished to proceed. All questions were answered. Sterile technique was used throughout the entire procedure. Please see nursing notes for vital signs. Test dose was given through epidural catheter and negative prior to continuing to dose epidural or start infusion. Warning signs of high block given to the  patient including shortness of breath, tingling/numbness in hands, complete motor block, or any concerning symptoms with instructions to call for help. Patient was given instructions on fall risk and not to get out of bed. All questions and concerns addressed with instructions to call with any issues or inadequate analgesia.   Patient tolerated the insertion well without complications.  Reason for block:procedure for pain

## 2017-01-26 LAB — CBC
HEMATOCRIT: 34.1 % — AB (ref 35.0–47.0)
Hemoglobin: 11.6 g/dL — ABNORMAL LOW (ref 12.0–16.0)
MCH: 32.1 pg (ref 26.0–34.0)
MCHC: 34 g/dL (ref 32.0–36.0)
MCV: 94.6 fL (ref 80.0–100.0)
Platelets: 184 10*3/uL (ref 150–440)
RBC: 3.6 MIL/uL — ABNORMAL LOW (ref 3.80–5.20)
RDW: 12.9 % (ref 11.5–14.5)
WBC: 15.5 10*3/uL — ABNORMAL HIGH (ref 3.6–11.0)

## 2017-01-26 LAB — RPR: RPR Ser Ql: NONREACTIVE

## 2017-01-26 MED ORDER — DIBUCAINE 1 % RE OINT: 1.0000 "application " | TOPICAL_OINTMENT | RECTAL | Status: DC | PRN

## 2017-01-26 MED ORDER — SENNOSIDES-DOCUSATE SODIUM 8.6-50 MG PO TABS
2.0000 | ORAL_TABLET | ORAL | Status: DC
  Administered 2017-01-26: 2 via ORAL
  Filled 2017-01-26: qty 2

## 2017-01-26 MED ORDER — OXYCODONE-ACETAMINOPHEN 5-325 MG PO TABS: 1.0000 | ORAL_TABLET | ORAL | Status: DC | PRN

## 2017-01-26 MED ORDER — OXYTOCIN 40 UNITS IN LACTATED RINGERS INFUSION - SIMPLE MED
2.5000 [IU]/h | INTRAVENOUS | Status: AC
  Administered 2017-01-26: 2.5 [IU]/h via INTRAVENOUS
  Filled 2017-01-26: qty 1000

## 2017-01-26 MED ORDER — COCONUT OIL OIL: 1.0000 "application " | TOPICAL_OIL | Status: DC | PRN

## 2017-01-26 MED ORDER — OXYCODONE-ACETAMINOPHEN 5-325 MG PO TABS: 2.0000 | ORAL_TABLET | ORAL | Status: DC | PRN

## 2017-01-26 MED ORDER — SIMETHICONE 80 MG PO CHEW
80.0000 mg | CHEWABLE_TABLET | ORAL | Status: DC | PRN
  Filled 2017-01-26: qty 1

## 2017-01-26 MED ORDER — DIPHENHYDRAMINE HCL 25 MG PO CAPS: 25.0000 mg | ORAL_CAPSULE | Freq: Four times a day (QID) | ORAL | Status: DC | PRN

## 2017-01-26 MED ORDER — SIMETHICONE 80 MG PO CHEW
80.0000 mg | CHEWABLE_TABLET | ORAL | Status: DC
  Administered 2017-01-26: 80 mg via ORAL

## 2017-01-26 MED ORDER — LACTATED RINGERS IV SOLN: INTRAVENOUS | Status: DC

## 2017-01-26 MED ORDER — PRENATAL MULTIVITAMIN CH
1.0000 | ORAL_TABLET | Freq: Every day | ORAL | Status: DC
  Administered 2017-01-26 – 2017-01-27 (×2): 1 via ORAL
  Filled 2017-01-26 (×3): qty 1

## 2017-01-26 MED ORDER — MENTHOL 3 MG MT LOZG
1.0000 | LOZENGE | OROMUCOSAL | Status: DC | PRN
  Filled 2017-01-26: qty 9

## 2017-01-26 MED ORDER — TETANUS-DIPHTH-ACELL PERTUSSIS 5-2.5-18.5 LF-MCG/0.5 IM SUSP
0.5000 mL | Freq: Once | INTRAMUSCULAR | Status: DC
  Filled 2017-01-26: qty 0.5

## 2017-01-26 MED ORDER — ACETAMINOPHEN 325 MG PO TABS: 650.0000 mg | ORAL_TABLET | ORAL | Status: DC | PRN

## 2017-01-26 MED ORDER — WITCH HAZEL-GLYCERIN EX PADS: 1.0000 "application " | MEDICATED_PAD | CUTANEOUS | Status: DC | PRN

## 2017-01-26 MED ORDER — IBUPROFEN 600 MG PO TABS
600.0000 mg | ORAL_TABLET | Freq: Four times a day (QID) | ORAL | Status: DC
  Administered 2017-01-26 – 2017-01-27 (×6): 600 mg via ORAL
  Filled 2017-01-26 (×7): qty 1

## 2017-01-26 MED ORDER — SIMETHICONE 80 MG PO CHEW
80.0000 mg | CHEWABLE_TABLET | Freq: Three times a day (TID) | ORAL | Status: DC
  Administered 2017-01-26 – 2017-01-27 (×6): 80 mg via ORAL
  Filled 2017-01-26 (×7): qty 1

## 2017-01-26 MED ORDER — OXYCODONE HCL 5 MG PO TABS: 5.0000 mg | ORAL_TABLET | Freq: Four times a day (QID) | ORAL | Status: DC | PRN

## 2017-01-26 NOTE — Anesthesia Post-op Follow-up Note (Signed)
  Anesthesia Pain Follow-up Note  Patient: Monique SladeMeagan E Kaufman  Day #: 1  Date of Follow-up: 01/26/2017 Time: 11:48 AM  Last Vitals:  Vitals:   01/26/17 0458 01/26/17 0808  BP: 107/69 121/79  Pulse: (!) 59 65  Resp: 19 18  Temp: 36.8 C 36.7 C    Level of Consciousness: alert  Pain: mild   Side Effects:None  Catheter Site Exam:clean, dry, no drainage     Plan: D/C from anesthesia care at surgeon's request  Harrold Fitchett

## 2017-01-26 NOTE — Plan of Care (Signed)
Problem: Activity: Goal: Ability to tolerate increased activity will improve Outcome: Progressing Up ambulating in room with slow, steady gait.  Problem: Life Cycle: Goal: Risk for postpartum hemorrhage will decrease Outcome: Progressing Scant Loachia. Fundus is firm.  Problem: Nutritional: Goal: Dietary intake will improve Outcome: Progressing Tolerating Reg. Diet  Problem: Pain Management: Goal: General experience of comfort will improve and pain level will decrease Outcome: Progressing Has on Q Pump and PRN meds that are controlling her pain.

## 2017-01-26 NOTE — Progress Notes (Signed)
Admit Date: 01/25/2017 Today's Date: 01/26/2017  Subjective: Postpartum Day 1: Cesarean Delivery Patient reports incisional pain, tolerating PO and no problems voiding.    Objective: Vital signs in last 24 hours: Temp:  [97.5 F (36.4 C)-98.7 F (37.1 C)] 98 F (36.7 C) (05/05 0808) Pulse Rate:  [50-75] 65 (05/05 0808) Resp:  [16-26] 18 (05/05 0808) BP: (104-154)/(54-96) 121/79 (05/05 0808) SpO2:  [95 %-100 %] 100 % (05/05 0808) Weight:  [152 lb (68.9 kg)] 152 lb (68.9 kg) (05/04 1404)  Physical Exam:  General: alert, cooperative and no distress Lochia: appropriate Uterine Fundus: firm Incision: healing well DVT Evaluation: No evidence of DVT seen on physical exam. Negative Homan's sign.   Recent Labs  01/25/17 1419 01/26/17 0632  HGB 13.2 11.6*  HCT 37.8 34.1*    Assessment/Plan: Status post Cesarean section. Doing well postoperatively.  Continue current care.  Monique Kaufman 01/26/2017, 10:05 AM

## 2017-01-26 NOTE — Anesthesia Postprocedure Evaluation (Addendum)
Anesthesia Post Note  Patient: Monique Kaufman  Procedure(s) Performed: Procedure(s) (LRB): CESAREAN SECTION (N/A)  Patient location during evaluation: Mother Baby Anesthesia Type: Epidural Level of consciousness: awake and alert and oriented Pain management: pain level controlled Vital Signs Assessment: post-procedure vital signs reviewed and stable Respiratory status: spontaneous breathing Cardiovascular status: blood pressure returned to baseline Postop Assessment: no headache and no backache Anesthetic complications: no     Last Vitals:  Vitals:   01/26/17 0458 01/26/17 0808  BP: 107/69 121/79  Pulse: (!) 59 65  Resp: 19 18  Temp: 36.8 C 36.7 C    Last Pain:  Vitals:   01/26/17 0808  TempSrc: Oral  PainSc:                  Vallorie Niccoli

## 2017-01-27 ENCOUNTER — Encounter: Payer: Self-pay | Admitting: Obstetrics and Gynecology

## 2017-01-27 MED ORDER — OXYCODONE-ACETAMINOPHEN 5-325 MG PO TABS: 2.0000 | ORAL_TABLET | ORAL | 0 refills | Status: DC | PRN

## 2017-01-27 MED ORDER — MEDROXYPROGESTERONE ACETATE 150 MG/ML IM SUSP
150.0000 mg | Freq: Once | INTRAMUSCULAR | Status: AC
  Administered 2017-01-27: 150 mg via INTRAMUSCULAR
  Filled 2017-01-27: qty 1

## 2017-01-27 NOTE — Progress Notes (Signed)
Discharge inst reviewed with parents.  Rx given for home use.  Verb u/o.  Taken to car via Emergency planning/management officerauxillary

## 2017-01-27 NOTE — Progress Notes (Signed)
Pt states that she is received TDAP at her 28 wk office visit.

## 2017-01-27 NOTE — Progress Notes (Signed)
Admit Date: 01/25/2017 Today's Date: 01/27/2017  Subjective: Postpartum Day 2: Cesarean Delivery Patient reports min incisional pain, tolerating PO and no problems voiding.    Objective: Vital signs in last 24 hours: Temp:  [97.7 F (36.5 C)-98.1 F (36.7 C)] 97.8 F (36.6 C) (05/06 0803) Pulse Rate:  [60-65] 61 (05/06 0803) Resp:  [17-18] 17 (05/06 0803) BP: (118-122)/(66-83) 122/83 (05/06 0803) SpO2:  [100 %] 100 % (05/06 0803)  Physical Exam:  General: alert, cooperative and no distress Lochia: appropriate Uterine Fundus: firm Incision: healing well DVT Evaluation: No evidence of DVT seen on physical exam. Negative Homan's sign.   Recent Labs  01/25/17 1419 01/26/17 0632  HGB 13.2 11.6*  HCT 37.8 34.1*    Assessment/Plan: Status post Cesarean section. Doing well postoperatively.  Continue current care.  Monique Kaufman Monique Kaufman 01/27/2017, 8:39 AM

## 2017-01-27 NOTE — Discharge Instructions (Signed)

## 2017-01-27 NOTE — Progress Notes (Signed)
Pt and family watching period of purple cry video and CPR video.

## 2017-01-28 LAB — CULTURE, BETA STREP (GROUP B ONLY): Strep Gp B Culture: POSITIVE — AB

## 2017-01-30 ENCOUNTER — Encounter: Payer: Medicaid Other | Admitting: Obstetrics and Gynecology

## 2017-01-30 ENCOUNTER — Ambulatory Visit (INDEPENDENT_AMBULATORY_CARE_PROVIDER_SITE_OTHER): Payer: Medicaid Other | Admitting: Obstetrics and Gynecology

## 2017-01-30 VITALS — BP 110/60 | HR 62 | Wt 145.0 lb

## 2017-01-30 DIAGNOSIS — Z4889 Encounter for other specified surgical aftercare: Secondary | ICD-10-CM

## 2017-01-30 NOTE — Progress Notes (Signed)
      Postoperative Follow-up Patient presents post op from Cesarean section 5days ago for fetal intolerance to labor.  Subjective: Patient reports some improvement in her preop symptoms. Eating a regular diet without difficulty. Pain is controlled with current analgesics. Medications being used: percocet & ibuprofen.  Activity: normal activities of daily living.  Objective: Vitals:   01/30/17 1400  BP: 110/60  Pulse: 62   Gen: NAD Abdomen: soft, non-tender, incision healing well  Assessment: 31 y.o. s/p 1lTCS for fetal intolerance to labor stable  Plan: Patient has done well after surgery with no apparent complications.  I have discussed the post-operative course to date, and the expected progress moving forward.  The patient understands what complications to be concerned about.  I will see the patient in routine follow up, or sooner if needed.    Activity plan: No heavy lifting.  Received depo provera in hospital prior to discharge  Vena Austriandreas Avelina Mcclurkin 01/30/2017, 2:40 PM

## 2017-02-05 ENCOUNTER — Telehealth: Payer: Self-pay

## 2017-02-05 NOTE — Telephone Encounter (Signed)
FMLA/DISABILITY form filled out for Monique Kaufman, FOB, and given to [place] for processing.

## 2017-03-06 ENCOUNTER — Ambulatory Visit (INDEPENDENT_AMBULATORY_CARE_PROVIDER_SITE_OTHER): Payer: Medicaid Other | Admitting: Obstetrics and Gynecology

## 2017-03-06 ENCOUNTER — Encounter: Payer: Self-pay | Admitting: Obstetrics and Gynecology

## 2017-03-06 DIAGNOSIS — R87810 Cervical high risk human papillomavirus (HPV) DNA test positive: Secondary | ICD-10-CM

## 2017-03-06 DIAGNOSIS — R87612 Low grade squamous intraepithelial lesion on cytologic smear of cervix (LGSIL): Secondary | ICD-10-CM

## 2017-03-06 MED ORDER — MEDROXYPROGESTERONE ACETATE 150 MG/ML IM SUSP: 150.0000 mg | INTRAMUSCULAR | 3 refills | Status: DC

## 2017-03-06 NOTE — Progress Notes (Signed)
Obstetrics & Gynecology Office Visit   Chief Complaint:  Chief Complaint  Patient presents with  . Postpartum Care    c/s on 5/4    History of Present Illness:  Date of delivery: SDE is required. Please contact your administrator to configure this SmartLink.  Type of delivery: Primary Cesarean section fetal intolerance to labor   Pt had the following problems during pregnancy or labor: Emergency or unplanned Cesarean delivery Patient has had what problems since the delivery: No GI, GU, Breast, or psychiatric concerns  Newborn Details:  SINGLETON :  1. Baby's Birth weight: 6 lbs., 2 oz.  Maternal Details:  Feeding plan: Breast Post partum depression/anxiety noted None Edinburgh Post-Partum Depression Score: 1 Date of last PAP: 07/04/2016 LSIL HPV positive normal colposcopy no bx  Review of Systems: 10 point review of systems negative unless otherwise noted in HPI  Past Medical History:  Past Medical History:  Diagnosis Date  . Atypical squamous cell changes of undetermined significance (ASCUS) on cervical cytology with negative high risk human papilloma virus (HPV) test result 11/17/2014  . Bulimia nervosa   . Bunion 2001; 2006; 2007   BILATERAL  . Chlamydia 11/17/2014  . Depression   . Heart murmur    SEPTAL DEFECT  . 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 CESAREAN SECTION N/A 01/25/2017   Procedure: CESAREAN SECTION;  Surgeon: Vena Austria, MD;  Location: [facility] ORS;  Service: Obstetrics;  Laterality: N/A;  . COLPOPEXY  08/08/2016   benign endocervical tissue with focally decidualized stroma compatible with progestin effect  . FOOT SURGERY  2001; 2006; 2007   left and right foot for bunion; two on the right and one on the left - between ages 22 & 43.    Gynecologic History: Patient's last menstrual period was 05/09/2016.  Obstetric History: G1P0101  Family  History:  Family History  Problem Relation Age of Onset  . Diabetes Father   . Alcoholism Paternal Aunt   . Alcoholism Maternal Grandfather   . Cancer Paternal Grandfather   . Breast cancer Neg Hx     Social History:  Social History   Social History  . Marital status: Single    Spouse name: N/A  . Number of children: N/A  . Years of education: N/A   Occupational History  . Not on file.   Social History Main Topics  . Smoking status: Never Smoker  . Smokeless tobacco: Never Used  . Alcohol use Yes     Comment: stopped after pregnancy confirmed  . Drug use: No  . Sexual activity: Yes    Birth control/ protection: Condom, None   Other Topics Concern  . Not on file   Social History Narrative  . No narrative on file    Allergies:  No Known Allergies  Medications: Prior to Admission medications   Medication Sig Start Date End Date Taking? Authorizing Provider  FENUGREEK PO Take by mouth.   Yes [provider]  medroxyPROGESTERone (DEPO-PROVERA) 150 MG/ML injection Inject 1 mL (150 mg total) into the muscle every 3 (three) months. 03/06/17 06/04/17  Vena Austria, MD    Physical Exam Vitals:  Vitals:   03/06/17 1005  BP: 104/66  Pulse: (!) 54   Patient's last menstrual period was 05/09/2016.  General: NAD HEENT: normocephalic, anicteric Pulmonary: No increased work of breathing  hepatomegaly, splenomegaly or masses palpable.  No evidence of hernia, incision well healed Genitourinary:  External: Normal external female genitalia.  Normal urethral meatus, normal Bartholin's and Skene's glands.    Vagina: Normal vaginal mucosa, no evidence of prolapse.    Cervix: Grossly normal in appearance, no bleeding  Uterus: Non-enlarged, mobile, normal contour.  No CMT  Adnexa: ovaries non-enlarged, no adnexal masses  Rectal: deferred  Lymphatic: no evidence of inguinal lymphadenopathy Extremities: no edema, erythema, or tenderness Neurologic: Grossly  intact Psychiatric: mood appropriate, affect full  Female chaperone present for pelvic and breast  portions of the physical exam  Assessment: 31 y.o. G1P0101 ;6 week postpartum visit  Plan: Problem List Items Addressed This Visit    None    Visit Diagnoses    Encounter for postpartum visit    -  Primary   Relevant Orders   PapIG, HPV, rfx 16/18   LGSIL on Pap smear of cervix       Relevant Orders   PapIG, HPV, rfx 16/18   Cervical high risk human papillomavirus (HPV) DNA test positive       Relevant Orders   PapIG, HPV, rfx 16/18      1) Pap smear - obtained today 2) Contraception - depo

## 2017-03-11 LAB — PAPIG, HPV, RFX 16/18
HPV Genotype, 16: NEGATIVE
HPV Genotype, 18: NEGATIVE
HPV, high-risk: POSITIVE — AB
PAP SMEAR COMMENT: 0

## 2017-03-15 ENCOUNTER — Telehealth: Payer: Self-pay | Admitting: Obstetrics and Gynecology

## 2017-03-15 ENCOUNTER — Encounter: Payer: Self-pay | Admitting: Obstetrics and Gynecology

## 2017-03-15 NOTE — Telephone Encounter (Signed)
Pt is calling about a missed call. Please call patient.

## 2017-03-20 ENCOUNTER — Telehealth: Payer: Self-pay | Admitting: Obstetrics and Gynecology

## 2017-03-20 NOTE — Telephone Encounter (Signed)
I called and left voicemail for patient to call back to schedule Colpo with Dr Bonney AidStaebler. Please schedule when patient calls back.

## 2017-03-20 NOTE — Telephone Encounter (Signed)
-----   Message from Vena AustriaAndreas Staebler, MD sent at 03/19/2017  3:31 PM EDT ----- Regarding: Colposcopy Needs colposcopy in the next 2-6 weeks

## 2017-03-25 NOTE — Telephone Encounter (Signed)
Pt is schedule 04/16/17 with AMS °

## 2017-03-25 NOTE — Telephone Encounter (Signed)
Attempted to reach patient and left voicemail to call back to be schedule

## 2017-04-16 ENCOUNTER — Encounter: Payer: Self-pay | Admitting: Obstetrics and Gynecology

## 2017-04-16 ENCOUNTER — Ambulatory Visit (INDEPENDENT_AMBULATORY_CARE_PROVIDER_SITE_OTHER): Payer: 59 | Admitting: Obstetrics and Gynecology

## 2017-04-16 VITALS — BP 120/74 | HR 57 | Wt 116.0 lb

## 2017-04-16 DIAGNOSIS — Z309 Encounter for contraceptive management, unspecified: Secondary | ICD-10-CM

## 2017-04-16 DIAGNOSIS — N72 Inflammatory disease of cervix uteri: Secondary | ICD-10-CM

## 2017-04-16 DIAGNOSIS — B977 Papillomavirus as the cause of diseases classified elsewhere: Secondary | ICD-10-CM

## 2017-04-16 DIAGNOSIS — Z3042 Encounter for surveillance of injectable contraceptive: Secondary | ICD-10-CM | POA: Diagnosis not present

## 2017-04-16 MED ORDER — MEDROXYPROGESTERONE ACETATE 150 MG/ML IM SUSP
150.0000 mg | Freq: Once | INTRAMUSCULAR | Status: AC
  Administered 2017-04-16: 150 mg via INTRAMUSCULAR

## 2017-04-16 NOTE — Addendum Note (Signed)
Addended by: [place]azil[place]JORDAN, Vennie Salsbury B on: 04/16/2017 11:10 AM   Modules accepted: Orders

## 2017-04-16 NOTE — Progress Notes (Signed)
GYNECOLOGY CLINIC COLPOSCOPY PROCEDURE NOTE  31 y.o. G1P0101 here for colposcopy for NIL HPV positive pap smear on 03/06/17. Discussed underlying role for HPV infection in the development of cervical dysplasia, its natural history and progression/regression, need for surveillance.  Is the patient  pregnant: No LMP: No LMP recorded. Smoking status:  Metrics: Intervention Frequency ACO  Documented Smoking Status Yearly  Screened one or more times in 24 months  Cessation Counseling or  Active cessation medication Past 24 months  Past 24 months   Guideline developer: UpToDate (See UpToDate for funding source) Date Released: 2014   Contraception: depo provera History of STD:  No. Future fertility desired: yes  Patient given informed consent, signed copy in the chart, time out was performed.  The patient was position in dorsal lithotomy position. Speculum was placed the cervix was visualized.   After application of acetic acid colposcopic inspection of the cervix was undertaken.   Colposcopy adequate, full visualization of transformation zone: Yes acetowhite lesion(s) noted at 2 o'clock; corresponding biopsies obtained.   ECC specimen obtained:  Yes All specimens were labeled and sent to pathology.   Patient was given post procedure instructions.  Will follow up pathology and manage accordingly.  Routine preventative health maintenance measures emphasized.  Physical Exam  Genitourinary:      Vena AustriaAndreas Willia Lampert, MD, Merlinda FrederickFACOG [facility], [facility]

## 2017-04-18 LAB — PATHOLOGY

## 2017-04-19 ENCOUNTER — Encounter: Payer: Self-pay | Admitting: Obstetrics and Gynecology

## 2017-04-25 ENCOUNTER — Ambulatory Visit: Payer: Medicaid Other

## 2017-05-31 ENCOUNTER — Ambulatory Visit (INDEPENDENT_AMBULATORY_CARE_PROVIDER_SITE_OTHER): Payer: 59 | Admitting: Licensed Clinical Social Worker

## 2017-05-31 DIAGNOSIS — F4323 Adjustment disorder with mixed anxiety and depressed mood: Secondary | ICD-10-CM
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
## 2017-06-17 ENCOUNTER — Ambulatory Visit (INDEPENDENT_AMBULATORY_CARE_PROVIDER_SITE_OTHER): Payer: 59 | Admitting: Licensed Clinical Social Worker

## 2017-06-17 DIAGNOSIS — F4323 Adjustment disorder with mixed anxiety and depressed mood: Secondary | ICD-10-CM | POA: Diagnosis not present

## 2017-06-19 NOTE — Progress Notes (Signed)
   THERAPIST PROGRESS NOTE  Session Time: 65  Participation Level: Active  Behavioral Response: Casual and NeatAlertEuthymic  Type of Therapy: Individual Therapy  Treatment Goals addressed: Coping  Interventions: CBT, Motivational Interviewing and Solution Focused  Summary: Monique Kaufman is a 31 y.o. female who presents with continued symptoms of her diagnosis.  Patient reports that she continues to have an open DSS case.  She reports that she need a letter to inform DSS that she attends her sessions.  Patient reports that she continues to not forgive her son's paternal Grandmother due to her allegations of her behavior.  Patient reports that she and her boyfriend struggle about parenting.  Patient was able to give examples.    Suicidal/Homicidal: No  Therapist Response: LCSW discussed what psychotherapy is and is not and the importance of the therapeutic relationship to include open and honest communication between client and therapist and building trust.  Reviewed advantages and disadvantages of the therapeutic process and limitations to the therapeutic relationship including LCSW's role in maintaining the safety of the client, others and those in client's care.   Plan: Return again in 2 weeks.  Diagnosis: Axis I: Adjustment Disorder with Mixed Emotional Features    Axis II: No diagnosis    Marinda Elk, LCSW 06/19/2017

## 2017-07-02 ENCOUNTER — Ambulatory Visit: Payer: 59 | Admitting: Licensed Clinical Social Worker

## 2017-07-09 ENCOUNTER — Ambulatory Visit (INDEPENDENT_AMBULATORY_CARE_PROVIDER_SITE_OTHER): Payer: 59

## 2017-07-09 DIAGNOSIS — Z23 Encounter for immunization: Secondary | ICD-10-CM

## 2017-07-11 ENCOUNTER — Ambulatory Visit: Payer: 59

## 2017-07-15 ENCOUNTER — Ambulatory Visit: Payer: 59

## 2017-07-15 ENCOUNTER — Ambulatory Visit (INDEPENDENT_AMBULATORY_CARE_PROVIDER_SITE_OTHER): Payer: 59

## 2017-07-15 DIAGNOSIS — Z3042 Encounter for surveillance of injectable contraceptive: Secondary | ICD-10-CM

## 2017-07-15 MED ORDER — MEDROXYPROGESTERONE ACETATE 150 MG/ML IM SUSP
150.0000 mg | Freq: Once | INTRAMUSCULAR | Status: AC
  Administered 2017-07-15: 150 mg via INTRAMUSCULAR

## 2017-10-07 ENCOUNTER — Ambulatory Visit (INDEPENDENT_AMBULATORY_CARE_PROVIDER_SITE_OTHER): Payer: Medicaid Other

## 2017-10-07 ENCOUNTER — Ambulatory Visit: Payer: 59

## 2017-10-07 DIAGNOSIS — Z3042 Encounter for surveillance of injectable contraceptive: Secondary | ICD-10-CM | POA: Diagnosis not present

## 2017-10-07 DIAGNOSIS — Z308 Encounter for other contraceptive management: Secondary | ICD-10-CM

## 2017-10-07 MED ORDER — MEDROXYPROGESTERONE ACETATE 150 MG/ML IM SUSP
150.0000 mg | Freq: Once | INTRAMUSCULAR | Status: AC
  Administered 2017-10-07: 150 mg via INTRAMUSCULAR

## 2017-10-07 NOTE — Progress Notes (Signed)
Pt here for depo which was given IM right glut.  NDC# [address]-2 

## 2017-12-30 ENCOUNTER — Ambulatory Visit: Payer: Medicaid Other

## 2018-01-03 ENCOUNTER — Ambulatory Visit (INDEPENDENT_AMBULATORY_CARE_PROVIDER_SITE_OTHER): Payer: BLUE CROSS/BLUE SHIELD

## 2018-01-03 DIAGNOSIS — Z3042 Encounter for surveillance of injectable contraceptive: Secondary | ICD-10-CM

## 2018-01-03 MED ORDER — MEDROXYPROGESTERONE ACETATE 150 MG/ML IM SUSP
150.0000 mg | Freq: Once | INTRAMUSCULAR | Status: AC
  Administered 2018-01-03: 150 mg via INTRAMUSCULAR

## 2018-03-20 ENCOUNTER — Other Ambulatory Visit: Payer: Self-pay | Admitting: Obstetrics and Gynecology

## 2018-04-03 ENCOUNTER — Ambulatory Visit (INDEPENDENT_AMBULATORY_CARE_PROVIDER_SITE_OTHER): Payer: BLUE CROSS/BLUE SHIELD

## 2018-04-03 DIAGNOSIS — Z3042 Encounter for surveillance of injectable contraceptive: Secondary | ICD-10-CM | POA: Diagnosis not present

## 2018-04-03 MED ORDER — MEDROXYPROGESTERONE ACETATE 150 MG/ML IM SUSP
150.0000 mg | Freq: Once | INTRAMUSCULAR | Status: AC
  Administered 2018-04-03: 150 mg via INTRAMUSCULAR

## 2018-04-03 NOTE — Progress Notes (Signed)
Pt here for depo injection. Gave 150mg  of Medroxyprogesterone Acetate in R Upper Outer Quadrant.  Patient tolerated well. Band-Aid applied.

## 2018-06-24 ENCOUNTER — Other Ambulatory Visit: Payer: Self-pay | Admitting: Obstetrics and Gynecology

## 2018-06-26 ENCOUNTER — Ambulatory Visit: Payer: BLUE CROSS/BLUE SHIELD

## 2018-06-28 DIAGNOSIS — Z23 Encounter for immunization: Secondary | ICD-10-CM | POA: Diagnosis not present

## 2018-07-03 ENCOUNTER — Ambulatory Visit (INDEPENDENT_AMBULATORY_CARE_PROVIDER_SITE_OTHER): Payer: BLUE CROSS/BLUE SHIELD

## 2018-07-03 DIAGNOSIS — Z3042 Encounter for surveillance of injectable contraceptive: Secondary | ICD-10-CM | POA: Diagnosis not present

## 2018-07-03 MED ORDER — MEDROXYPROGESTERONE ACETATE 150 MG/ML IM SUSP
150.0000 mg | Freq: Once | INTRAMUSCULAR | Status: AC
  Administered 2018-07-03: 150 mg via INTRAMUSCULAR

## 2018-07-03 NOTE — Progress Notes (Signed)
Pt here for depo which was given IM right glut.  NDC# [address]-2 

## 2018-07-16 DIAGNOSIS — S0990XA Unspecified injury of head, initial encounter: Secondary | ICD-10-CM | POA: Diagnosis not present

## 2018-07-16 DIAGNOSIS — R112 Nausea with vomiting, unspecified: Secondary | ICD-10-CM | POA: Diagnosis not present

## 2018-07-16 DIAGNOSIS — E161 Other hypoglycemia: Secondary | ICD-10-CM | POA: Diagnosis not present

## 2018-07-16 DIAGNOSIS — R42 Dizziness and giddiness: Secondary | ICD-10-CM | POA: Diagnosis not present

## 2018-07-16 DIAGNOSIS — I959 Hypotension, unspecified: Secondary | ICD-10-CM | POA: Diagnosis not present

## 2018-07-16 DIAGNOSIS — R197 Diarrhea, unspecified: Secondary | ICD-10-CM | POA: Diagnosis not present

## 2018-07-16 DIAGNOSIS — R001 Bradycardia, unspecified: Secondary | ICD-10-CM | POA: Diagnosis not present

## 2018-07-16 DIAGNOSIS — R55 Syncope and collapse: Secondary | ICD-10-CM | POA: Diagnosis not present

## 2018-07-16 DIAGNOSIS — R1030 Lower abdominal pain, unspecified: Secondary | ICD-10-CM | POA: Diagnosis not present

## 2018-07-16 DIAGNOSIS — E162 Hypoglycemia, unspecified: Secondary | ICD-10-CM | POA: Diagnosis not present

## 2018-07-18 ENCOUNTER — Ambulatory Visit: Payer: BLUE CROSS/BLUE SHIELD | Admitting: Obstetrics and Gynecology

## 2018-08-28 IMAGING — US US MFM OB DETAIL+14 WK
1 series · 12 of 28 positions shown · non-contrast
Comparison: none

PATIENT INFO:

PERFORMED BY:
SERVICE(S) PROVIDED:
INDICATIONS:
18 weeks gestation of pregnancy
FETAL EVALUATION:
Num Of Fetuses:     1
Fetal Heart         137
Rate(bpm):
Presentation:       Cephalic
Placenta:           Posterior, fundal
BIOMETRY:
BPD:      44.4  mm     G. Age:  19w 3d         91  %    CI:        73.54   %    70 - 86
FL/HC:       15.7  %    15.8 - 18
HC:      164.5  mm     G. Age:  19w 1d         82  %
FL/BPD:      58.1  %
FL:       25.8  mm     G. Age:  17w 6d         28  %
HUM:      25.1  mm     G. Age:  17w 6d         41  %
CER:      15.9  mm     G. Age:  15w 6d        < 5  %
CM:          5  mm
GESTATIONAL AGE:
LMP:           17w 1d        Date:  05/23/16                 EDD:   02/27/17
U/S Today:     18w 6d                                        EDD:   02/15/17
Best:          18w 2d     Det. By:  Early Ultrasound         EDD:   02/19/17
(07/18/16)
ANATOMY:
Cavum:                 CSP visualized         Aortic Arch:            Normal appearance
Ventricles:            Normal appearance      Ductal Arch:            Normal appearance
Choroid Plexus:        Within Normal Limits   Diaphragm:              Within Normal Limits
Cerebellum:            Within Normal Limits   Stomach:                Seen
Posterior Fossa:       Within Normal Limits   Abdomen:                Within Normal
Limits
Nuchal Fold:           Within Normal Limits   Abdominal Wall:         Normal appearance
Face:                  Orbits visualized      Cord Vessels:           3 vessels
Lips:                  Normal appearance      Kidneys:                Normal appearance
Thoracic:              Within Normal Limits   Bladder:                Seen
Heart:                 4-Chamber view         Spine:                  Normal appearance
appears normal
RVOT:                  Normal appearance      Upper Extremities:      Visualized
LVOT:                  Normal appearance      Lower Extremities:      Visualized
CERVIX UTERUS ADNEXA:
Cervix
Length:           2.82  cm.

[Series 1: us mfm ob detail+14 wk · 0.20mm/px · 85 acquisitions, 12 frames shown]
[im 4/85]
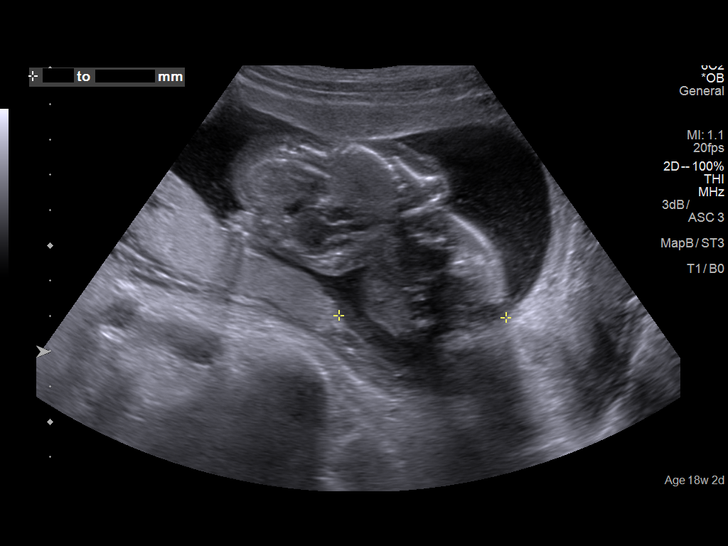
[im 10/85]
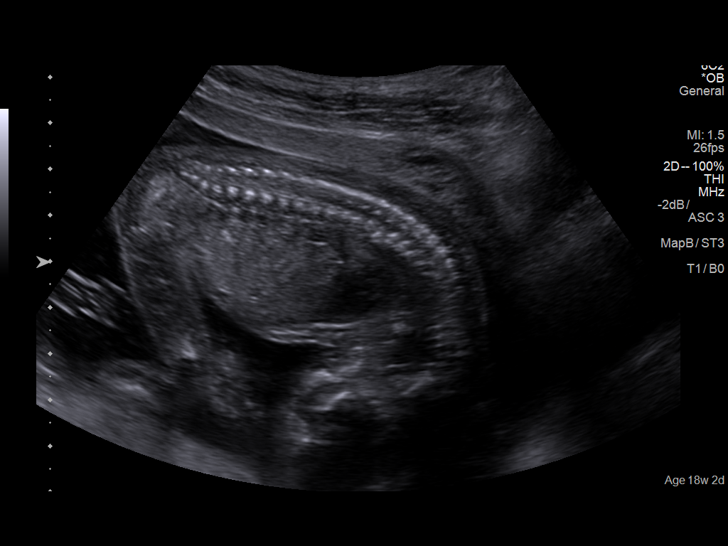
[im 16/85]
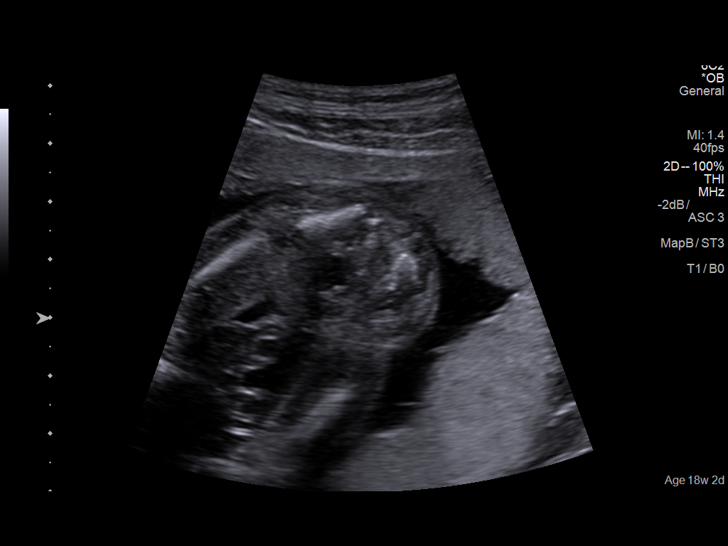
[im 25/85]
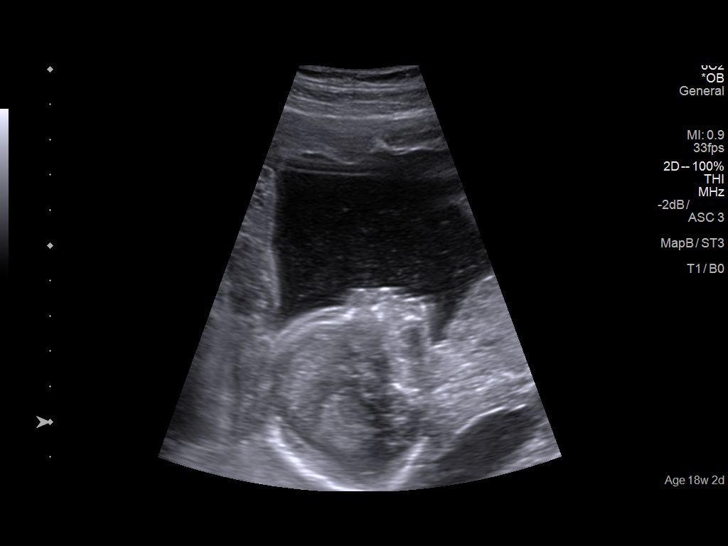
[im 32/85]
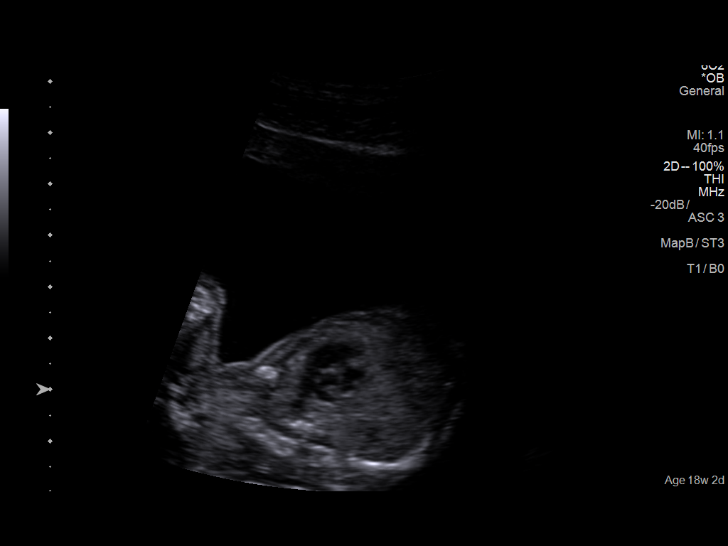
[im 38/85]
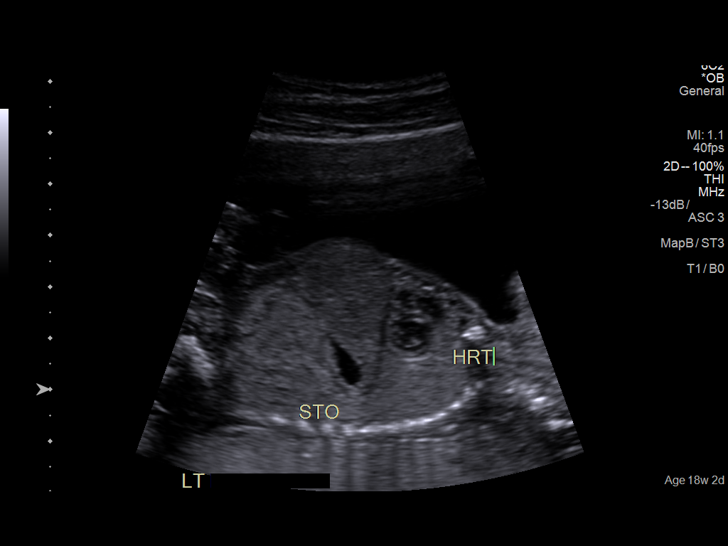
[im 47/85]
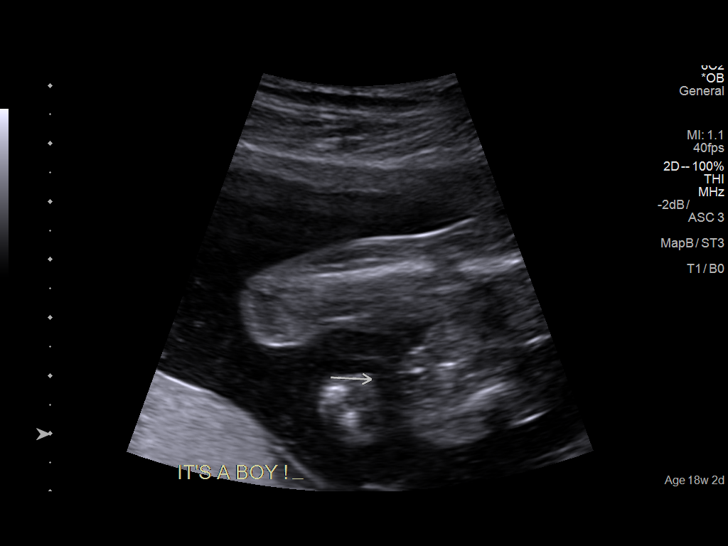
[im 53/85]
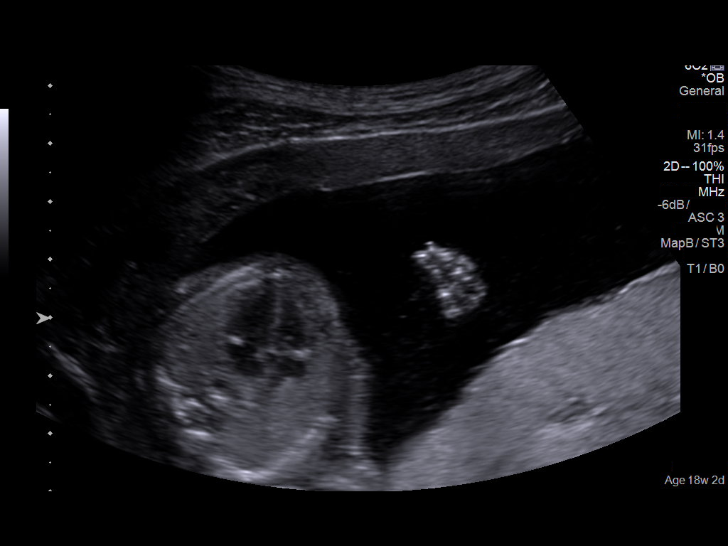
[im 60/85]
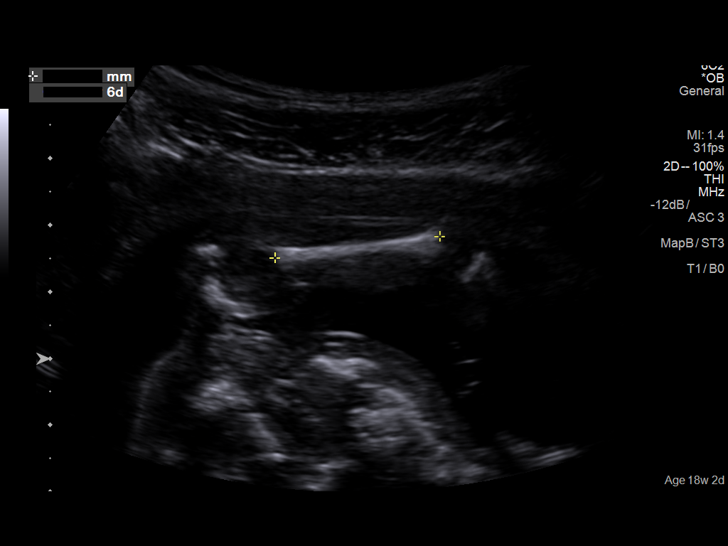
[im 69/85]
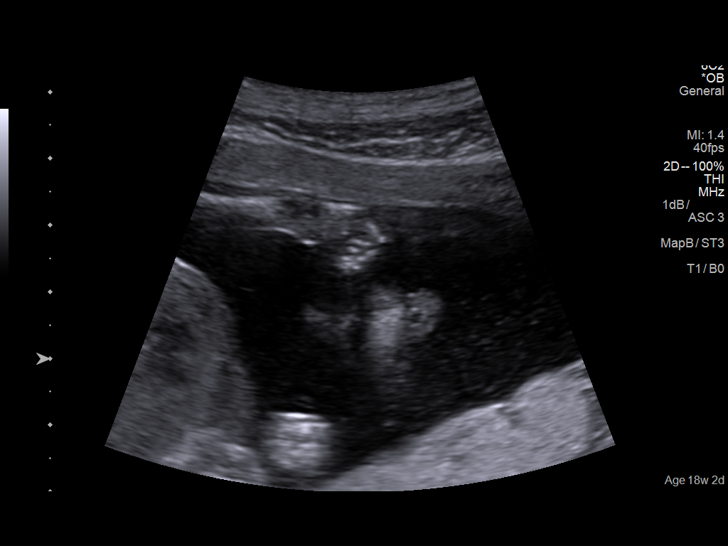
[im 75/85]
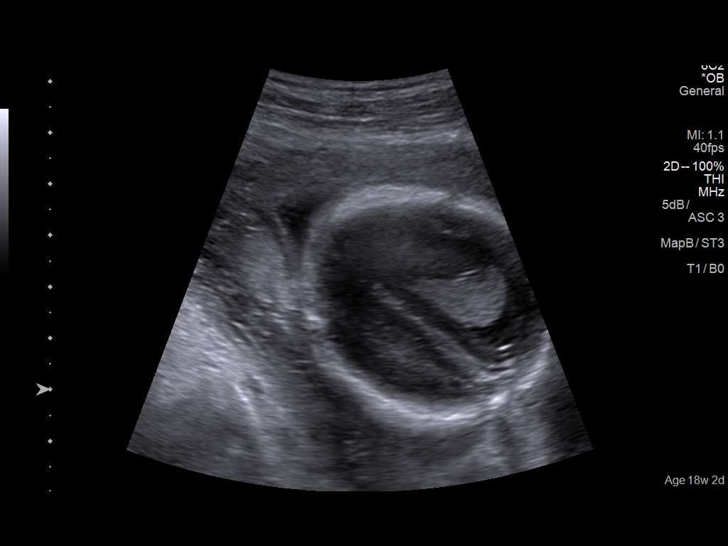
[im 81/85]
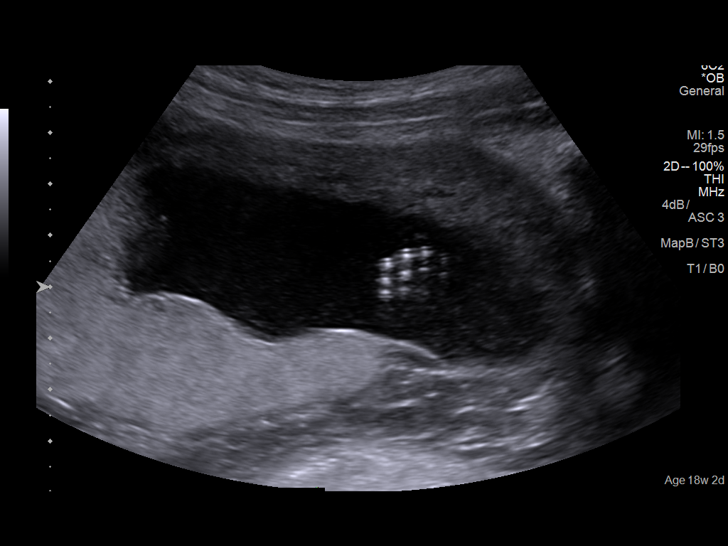

[12 of 28 positions shown; findings below may reference images not displayed]

IMPRESSION: Dear Dr.   Klai,

Thank you for referring your patient to [facility] for a
fetal anatomical survey due to alcohol exposure during this
pregnancy.

There is a singleton gestation with subjectively normal
amniotic fluid volume.

The fetal biometry correlates with established dating.

Detailed evaluation of the fetal anatomy was performed.  The
fetal anatomical survey appears within normal limits within
the resolution of ultrasound as described above.

It must be noted that a normal ultrasound cannot rule out
aneuploidy.

Of note, the patient reports that she had a patent ductus
arteriosus as a child. While it is typically recommended for a
fetal echocardiogram in the setting of maternal congenital
cardiac disease, this is not a structural abnormality but rather
a functional one. Therefore, a fetal echocardiogram may be
considered based on your and your patient's level of clinical
concern.

Thank you for allowing us to participate in your patient's care.

assistance.

## 2018-09-23 ENCOUNTER — Other Ambulatory Visit: Payer: Self-pay | Admitting: Obstetrics and Gynecology

## 2018-09-25 ENCOUNTER — Ambulatory Visit (INDEPENDENT_AMBULATORY_CARE_PROVIDER_SITE_OTHER): Payer: BLUE CROSS/BLUE SHIELD

## 2018-09-25 DIAGNOSIS — Z3042 Encounter for surveillance of injectable contraceptive: Secondary | ICD-10-CM

## 2018-09-25 MED ORDER — MEDROXYPROGESTERONE ACETATE 150 MG/ML IM SUSP
150.0000 mg | Freq: Once | INTRAMUSCULAR | Status: AC
  Administered 2018-09-25: 150 mg via INTRAMUSCULAR

## 2018-09-27 DIAGNOSIS — R509 Fever, unspecified: Secondary | ICD-10-CM | POA: Diagnosis not present

## 2018-09-27 DIAGNOSIS — J101 Influenza due to other identified influenza virus with other respiratory manifestations: Secondary | ICD-10-CM | POA: Diagnosis not present

## 2018-12-12 ENCOUNTER — Other Ambulatory Visit: Payer: Self-pay | Admitting: Obstetrics and Gynecology

## 2018-12-12 MED ORDER — MEDROXYPROGESTERONE ACETATE 150 MG/ML IM SUSY: 150.0000 mg | PREFILLED_SYRINGE | INTRAMUSCULAR | 2 refills | Status: DC

## 2018-12-18 ENCOUNTER — Ambulatory Visit (INDEPENDENT_AMBULATORY_CARE_PROVIDER_SITE_OTHER): Payer: BLUE CROSS/BLUE SHIELD

## 2018-12-18 ENCOUNTER — Other Ambulatory Visit: Payer: Self-pay

## 2018-12-18 ENCOUNTER — Ambulatory Visit: Payer: BLUE CROSS/BLUE SHIELD | Admitting: Obstetrics and Gynecology

## 2018-12-18 DIAGNOSIS — Z3042 Encounter for surveillance of injectable contraceptive: Secondary | ICD-10-CM

## 2018-12-18 MED ORDER — MEDROXYPROGESTERONE ACETATE 150 MG/ML IM SUSP
150.0000 mg | Freq: Once | INTRAMUSCULAR | Status: AC
  Administered 2018-12-18: 150 mg via INTRAMUSCULAR

## 2019-01-29 ENCOUNTER — Ambulatory Visit: Payer: BLUE CROSS/BLUE SHIELD | Admitting: Obstetrics and Gynecology

## 2019-03-04 ENCOUNTER — Ambulatory Visit: Payer: BLUE CROSS/BLUE SHIELD | Admitting: Obstetrics and Gynecology

## 2019-03-12 ENCOUNTER — Ambulatory Visit: Payer: BLUE CROSS/BLUE SHIELD

## 2019-03-12 NOTE — Progress Notes (Signed)
Gynecology Annual Exam   PCP: Nonda LouShapely-Quinn, Todd , MD  Chief Complaint:  Chief Complaint  Patient presents with  . Gynecologic Exam  . Contraception    Depo Provera injection given today    History of Present Illness: Patient is a 33 y.o. G1P0101 presents for annual exam. The patient has no complaints today.   LMP: No LMP recorded. Patient has had an injection. Amenorrhea on depo provera.  The patient is not currently sexually active. Separated from her husband 8 months ago.  She currently uses Depo-Provera injections for contraception. She denies dyspareunia.  .  There is no notable family history of breast or ovarian cancer in her family.  The patient wears seatbelts: yes.   The patient has regular exercise: not asked.    The patient denies current symptoms of depression.    Review of Systems: Review of Systems  Constitutional: Negative for chills and fever.  HENT: Negative for congestion.   Respiratory: Negative for cough and shortness of breath.   Cardiovascular: Negative for chest pain and palpitations.  Gastrointestinal: Negative for abdominal pain, constipation, diarrhea, heartburn, nausea and vomiting.  Genitourinary: Negative for dysuria, frequency and urgency.  Skin: Negative for itching and rash.  Neurological: Negative for dizziness and headaches.  Endo/Heme/Allergies: Negative for polydipsia.  Psychiatric/Behavioral: Negative for depression.    Past Medical History:  Past Medical History:  Diagnosis Date  . Atypical squamous cell changes of undetermined significance (ASCUS) on cervical cytology with negative high risk human papilloma virus (HPV) test result 11/17/2014  . Bulimia nervosa   . Bunion 2001; 2006; 2007   BILATERAL  . Chlamydia 11/17/2014  . Depression   . Heart murmur    SEPTAL DEFECT  . 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 CESAREAN  SECTION N/A 01/25/2017   Procedure: CESAREAN SECTION;  Surgeon: Vena AustriaStaebler, Leonidas Boateng, MD;  Location: [facility] ORS;  Service: Obstetrics;  Laterality: N/A;  . COLPOPEXY  08/08/2016   benign endocervical tissue with focally decidualized stroma compatible with progestin effect  . FOOT SURGERY  2001; 2006; 2007   left and right foot for bunion; two on the right and one on the left - between ages [age] & [age].    Gynecologic History:  No LMP recorded. Patient has had an injection. Contraception: Depo-Provera injections Last Pap: Results were: 04/16/2017 Colposcopy negative ectocervix, insufficient ECC, normal findings  03/06/2017  NIL and HR HPV+  07/04/2016 LSIL HPV positive normal colposcopy no bx (pregnant)  Obstetric History: G1P0101  Family History:  Family History  Problem Relation Age of Onset  . Diabetes Father   . Alcoholism Paternal Aunt   . Alcoholism Maternal Grandfather   . Cancer Paternal Grandfather   . Breast cancer Neg Hx     Social History:  Social History   Socioeconomic History  . Marital status: Single    Spouse name: Not on file  . Number of children: Not on file  . Years of education: Not on file  . Highest education level: Not on file  Occupational History  . Not on file  Social Needs  . Financial resource strain: Not on file  . Food insecurity    Worry: Not on file    Inability: Not on file  . Transportation needs    Medical: Not on file    Non-medical: Not on file  Tobacco Use  .  Smoking status: Never Smoker  . Smokeless tobacco: Never Used  Substance and Sexual Activity  . Alcohol use: Yes    Comment: stopped after pregnancy confirmed  . Drug use: No  . Sexual activity: Yes    Birth control/protection: None, Injection  Lifestyle  . Physical activity    Days per week: Not on file    Minutes per session: Not on file  . Stress: Not on file  Relationships  . Social Herbalist on phone: Not on file    Gets together: Not on file    Attends  religious service: Not on file    Active member of club or organization: Not on file    Attends meetings of clubs or organizations: Not on file    Relationship status: Not on file  . Intimate partner violence    Fear of current or ex partner: Not on file    Emotionally abused: Not on file    Physically abused: Not on file    Forced sexual activity: Not on file  Other Topics Concern  . Not on file  Social History Narrative  . Not on file    Allergies:  No Known Allergies  Medications: Prior to Admission medications   Medication Sig Start Date End Date Taking? Authorizing Provider  medroxyPROGESTERone Acetate 150 MG/ML SUSY Inject 1 mL (150 mg total) into the muscle every 3 (three) months. 12/12/18   Malachy Mood, MD  Prenat-FeFum-FePo-FA-Omega 3 (TARON-C DHA) 53.5-38-1 MG CAPS Take 1 capsule by mouth daily. 03/13/17   [provider]    Physical Exam Vitals: currently breastfeeding.  General: NAD HEENT: normocephalic, anicteric Thyroid: no enlargement, no palpable nodules Pulmonary: No increased work of breathing, CTAB Cardiovascular: RRR, distal pulses 2+ Breast: Breast symmetrical, no tenderness, no palpable nodules or masses, no skin or nipple retraction present, no nipple discharge.  No axillary or supraclavicular lymphadenopathy. Abdomen: NABS, soft, non-tender, non-distended.  Umbilicus without lesions.  No hepatomegaly, splenomegaly or masses palpable. No evidence of hernia  Genitourinary:  External: Normal external female genitalia.  Normal urethral meatus, normal Bartholin's and Skene's glands.    Vagina: Normal vaginal mucosa, no evidence of prolapse.    Cervix: Grossly normal in appearance, no bleeding  Uterus: Non-enlarged, mobile, normal contour.  No CMT  Adnexa: ovaries non-enlarged, no adnexal masses  Rectal: deferred  Lymphatic: no evidence of inguinal lymphadenopathy Extremities: no edema, erythema, or tenderness Neurologic: Grossly intact  Psychiatric: mood appropriate, affect full  Female chaperone present for pelvic and breast  portions of the physical exam      Assessment: 33 y.o. G1P0101 routine annual exam  Plan: Problem List Items Addressed This Visit    None    Visit Diagnoses    Screening for malignant neoplasm of cervix    -  Primary   Relevant Orders   Cytology - PAP   Encounter for gynecological examination without abnormal finding       Breast screening       Encounter for surveillance of injectable contraceptive       Relevant Medications   medroxyPROGESTERone (DEPO-PROVERA) injection 150 mg (Completed)   Galactorrhea in female       Relevant Orders   Prolactin   Routine screening for STI (sexually transmitted infection)       Relevant Orders   Cytology - PAP   HEP, RPR, HIV Panel      2) STI screening  wasoffered and accepted  2)  ASCCP guidelines and  rational discussed.  Patient opts for yearly screening interval - follow up from negative colposcopy last year  3) Contraception - the patient is currently using  Depo-Provera injections.  She is happy with her current form of contraception and plans to continue  4) Routine healthcare maintenance including cholesterol, diabetes screening discussed managed by PCP  5) Galactorrhea occasionally - normal breast exam today.  Will check prolactin level  6) Return in about 12 weeks (around 06/05/2019), or For Depo Provera.   Vena AustriaAndreas Jamani Bearce, MD, Evern CoreFACOG [facility], [facility] 03/13/2019, 11:14 AM

## 2019-03-13 ENCOUNTER — Other Ambulatory Visit: Payer: Self-pay

## 2019-03-13 ENCOUNTER — Other Ambulatory Visit (HOSPITAL_COMMUNITY)
Admission: RE | Admit: 2019-03-13 | Discharge: 2019-03-13 | Disposition: A | Discharge status: Home / Self Care | Payer: BLUE CROSS/BLUE SHIELD | Source: Ambulatory Visit | Attending: Obstetrics and Gynecology | Admitting: Obstetrics and Gynecology

## 2019-03-13 ENCOUNTER — Ambulatory Visit (INDEPENDENT_AMBULATORY_CARE_PROVIDER_SITE_OTHER): Payer: BLUE CROSS/BLUE SHIELD | Admitting: Obstetrics and Gynecology

## 2019-03-13 ENCOUNTER — Encounter: Payer: Self-pay | Admitting: Obstetrics and Gynecology

## 2019-03-13 VITALS — BP 120/74 | HR 83 | Ht 64.0 in | Wt 123.0 lb

## 2019-03-13 DIAGNOSIS — Z113 Encounter for screening for infections with a predominantly sexual mode of transmission: Secondary | ICD-10-CM

## 2019-03-13 DIAGNOSIS — N643 Galactorrhea not associated with childbirth: Secondary | ICD-10-CM

## 2019-03-13 DIAGNOSIS — Z124 Encounter for screening for malignant neoplasm of cervix: Secondary | ICD-10-CM | POA: Diagnosis not present

## 2019-03-13 DIAGNOSIS — Z01419 Encounter for gynecological examination (general) (routine) without abnormal findings: Secondary | ICD-10-CM | POA: Diagnosis not present

## 2019-03-13 DIAGNOSIS — Z1239 Encounter for other screening for malignant neoplasm of breast: Secondary | ICD-10-CM

## 2019-03-13 DIAGNOSIS — Z3042 Encounter for surveillance of injectable contraceptive: Secondary | ICD-10-CM

## 2019-03-13 MED ORDER — MEDROXYPROGESTERONE ACETATE 150 MG/ML IM SUSY: 150.0000 mg | PREFILLED_SYRINGE | INTRAMUSCULAR | 3 refills | Status: DC

## 2019-03-13 MED ORDER — MEDROXYPROGESTERONE ACETATE 150 MG/ML IM SUSP
150.0000 mg | Freq: Once | INTRAMUSCULAR | Status: AC
  Administered 2019-03-13: 150 mg via INTRAMUSCULAR

## 2019-03-14 LAB — HEP, RPR, HIV PANEL
HIV Screen 4th Generation wRfx: NONREACTIVE
Hepatitis B Surface Ag: NEGATIVE
RPR Ser Ql: NONREACTIVE

## 2019-03-14 LAB — PROLACTIN: Prolactin: 13.3 ng/mL (ref 4.8–23.3)

## 2019-03-20 LAB — CYTOLOGY - PAP
Adequacy: ABSENT
Chlamydia: NEGATIVE
Diagnosis: NEGATIVE
HPV 16/18/45 genotyping: NEGATIVE
HPV: DETECTED — AB
Neisseria Gonorrhea: NEGATIVE

## 2019-03-23 ENCOUNTER — Telehealth: Payer: Self-pay | Admitting: Obstetrics and Gynecology

## 2019-03-23 NOTE — Telephone Encounter (Signed)
-----   Message from Malachy Mood, MD sent at 03/21/2019 12:05 PM EDT ----- Regarding: Colposcopy Needs colposcopy sometime in the next 3-4 weeks

## 2019-03-23 NOTE — Telephone Encounter (Signed)
Called and left voice mail for patient to call back to be schedule °

## 2019-04-01 DIAGNOSIS — Z20828 Contact with and (suspected) exposure to other viral communicable diseases: Secondary | ICD-10-CM | POA: Diagnosis not present

## 2019-06-05 ENCOUNTER — Encounter: Payer: Self-pay | Admitting: Obstetrics and Gynecology

## 2019-06-05 ENCOUNTER — Ambulatory Visit: Payer: BLUE CROSS/BLUE SHIELD

## 2019-06-05 ENCOUNTER — Other Ambulatory Visit (HOSPITAL_COMMUNITY)
Admission: RE | Admit: 2019-06-05 | Discharge: 2019-06-05 | Disposition: A | Discharge status: Home / Self Care | Payer: BLUE CROSS/BLUE SHIELD | Source: Ambulatory Visit | Attending: Obstetrics and Gynecology | Admitting: Obstetrics and Gynecology

## 2019-06-05 ENCOUNTER — Other Ambulatory Visit: Payer: Self-pay

## 2019-06-05 ENCOUNTER — Ambulatory Visit (INDEPENDENT_AMBULATORY_CARE_PROVIDER_SITE_OTHER): Payer: BLUE CROSS/BLUE SHIELD | Admitting: Obstetrics and Gynecology

## 2019-06-05 VITALS — BP 118/76 | HR 63 | Ht 64.0 in | Wt 124.0 lb

## 2019-06-05 DIAGNOSIS — Z3042 Encounter for surveillance of injectable contraceptive: Secondary | ICD-10-CM

## 2019-06-05 DIAGNOSIS — B977 Papillomavirus as the cause of diseases classified elsewhere: Secondary | ICD-10-CM

## 2019-06-05 DIAGNOSIS — N870 Mild cervical dysplasia: Secondary | ICD-10-CM | POA: Diagnosis not present

## 2019-06-05 DIAGNOSIS — N72 Inflammatory disease of cervix uteri: Secondary | ICD-10-CM | POA: Diagnosis not present

## 2019-06-05 MED ORDER — MEDROXYPROGESTERONE ACETATE 150 MG/ML IM SUSP
150.0000 mg | Freq: Once | INTRAMUSCULAR | Status: AC
  Administered 2019-06-05: 150 mg via INTRAMUSCULAR

## 2019-06-05 NOTE — Progress Notes (Signed)
   GYNECOLOGY CLINIC COLPOSCOPY PROCEDURE NOTE  33 y.o. G1P0101 here for colposcopy for NIL and HR HPV+  pap smear on 03/12/2018. Discussed underlying role for HPV infection in the development of cervical dysplasia, its natural history and progression/regression, need for surveillance.  Pap History 03/13/2019 NIL HPV positive 16/18 negative 04/16/2017 Colposcopy negative 03/05/2017 NIL HPV positive 08/08/2016 Colposcopy negative 07/04/2016 LGSIL HPV positive 11/17/2014 ASCUS HPV negative 07/08/2012 NIL HPV negative 04/02/2011 NIL 01/20/2008 NIL 01/14/2007 NIL 01/10/2006 NIL  Is the patient  pregnant: No LMP: No LMP recorded. Patient has had an injection. Smoking status:  reports that she has never smoked. She has never used smokeless tobacco. Contraception: Depo-Provera injections Future fertility desired:  Yes  Patient given informed consent, signed copy in the chart, time out was performed.  The patient was position in dorsal lithotomy position. Speculum was placed the cervix was visualized.   After application of acetic acid colposcopic inspection of the cervix was undertaken.   Colposcopy adequate, full visualization of transformation zone: Yes no visible lesions; corresponding biopsies obtained.   ECC specimen obtained:  Yes  All specimens were labeled and sent to pathology.   Patient was given post procedure instructions.  Will follow up pathology and manage accordingly.  Routine preventative health maintenance measures emphasized.  OBGyn Exam     Malachy Mood, MD, [facility], [facility]

## 2019-06-15 ENCOUNTER — Other Ambulatory Visit: Payer: Self-pay | Admitting: Obstetrics and Gynecology

## 2019-06-15 MED ORDER — FLUCONAZOLE 150 MG PO TABS: 150.0000 mg | ORAL_TABLET | Freq: Once | ORAL | 0 refills | Status: AC

## 2019-06-15 NOTE — Progress Notes (Signed)
Candida symptoms, Rx of diflucan

## 2019-06-15 NOTE — Telephone Encounter (Signed)
AMS has spoken to pt

## 2019-07-03 NOTE — Telephone Encounter (Signed)
Needs to be seen for STI testing for Monday

## 2019-07-06 ENCOUNTER — Encounter: Payer: Self-pay | Admitting: Obstetrics and Gynecology

## 2019-07-06 ENCOUNTER — Other Ambulatory Visit (HOSPITAL_COMMUNITY)
Admission: RE | Admit: 2019-07-06 | Discharge: 2019-07-06 | Disposition: A | Discharge status: Home / Self Care | Payer: BLUE CROSS/BLUE SHIELD | Source: Ambulatory Visit | Attending: Obstetrics and Gynecology | Admitting: Obstetrics and Gynecology

## 2019-07-06 ENCOUNTER — Ambulatory Visit (INDEPENDENT_AMBULATORY_CARE_PROVIDER_SITE_OTHER): Payer: BLUE CROSS/BLUE SHIELD | Admitting: Obstetrics and Gynecology

## 2019-07-06 ENCOUNTER — Other Ambulatory Visit: Payer: Self-pay

## 2019-07-06 VITALS — BP 110/70 | Ht 64.0 in | Wt 124.0 lb

## 2019-07-06 DIAGNOSIS — N898 Other specified noninflammatory disorders of vagina: Secondary | ICD-10-CM

## 2019-07-06 DIAGNOSIS — N760 Acute vaginitis: Secondary | ICD-10-CM

## 2019-07-06 DIAGNOSIS — Z113 Encounter for screening for infections with a predominantly sexual mode of transmission: Secondary | ICD-10-CM | POA: Insufficient documentation

## 2019-07-06 DIAGNOSIS — B9689 Other specified bacterial agents as the cause of diseases classified elsewhere: Secondary | ICD-10-CM

## 2019-07-06 LAB — POCT WET PREP WITH KOH
Clue Cells Wet Prep HPF POC: POSITIVE
KOH Prep POC: POSITIVE — AB
Trichomonas, UA: NEGATIVE
Yeast Wet Prep HPF POC: NEGATIVE

## 2019-07-06 MED ORDER — METRONIDAZOLE 500 MG PO TABS: 500.0000 mg | ORAL_TABLET | Freq: Two times a day (BID) | ORAL | 0 refills | Status: AC

## 2019-07-06 NOTE — Progress Notes (Signed)
Shapely-Quinn, Monique Lucyodd Washougal, MD   Chief Complaint  Patient presents with  . STD screening    HPI:      [name] is a 33 y.o. G1P0101 who LMP was No LMP recorded. Patient has had an injection., presents today for STD testing and issues with vaginal d/c with irritation/itching, initially with fishy odor, sometimes with dysuria. Sx for about 6-8 wks. Treated with monistat-3 and 7 as well as 2 diflucan without relief. Pt also with vaginal dyspareunia and occas postcoital bleeding, which is unusual for pt. No LBP, pelvic pain, fevers, no urin sx, no lesions. Uses scented soap, no dryer sheets.  Pt is sex active with old partner since 3/20. He had had another partner when they were broken up, so pt concerned about STDs. Pt on depo and amenorrheic, although having increased BTB the past few months.  Pt current on pap, had colpo 6/20 with Dr. Bonney Kaufman.    Past Medical History:  Diagnosis Date  . Atypical squamous cell changes of undetermined significance (ASCUS) on cervical cytology with negative high risk human papilloma virus (HPV) test result 11/17/2014  . Bulimia nervosa   . Bunion 2001; 2006; 2007   BILATERAL  . Chlamydia 11/17/2014  . Depression   . Heart murmur    SEPTAL DEFECT  . 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 CESAREAN SECTION N/A 01/25/2017   Procedure: CESAREAN SECTION;  Surgeon: Monique Kaufman, Andreas, MD;  Location: [facility] ORS;  Service: Obstetrics;  Laterality: N/A;  . COLPOPEXY  08/08/2016   benign endocervical tissue with focally decidualized stroma compatible with progestin effect  . FOOT SURGERY  2001; 2006; 2007   left and right foot for bunion; two on the right and one on the left - between ages [age] & [age].    Family History  Problem Relation Age of Onset  . Diabetes Father   . Alcoholism Paternal Aunt   . Alcoholism Maternal Grandfather   . Cancer Paternal Grandfather   . Breast  cancer Neg Hx     Social History   Socioeconomic History  . Marital status: Single    Spouse name: Not on file  . Number of children: Not on file  . Years of education: Not on file  . Highest education level: Not on file  Occupational History  . Not on file  Social Needs  . Financial resource strain: Not on file  . Food insecurity    Worry: Not on file    Inability: Not on file  . Transportation needs    Medical: Not on file    Non-medical: Not on file  Tobacco Use  . Smoking status: Never Smoker  . Smokeless tobacco: Never Used  Substance and Sexual Activity  . Alcohol use: Yes    Comment: stopped after pregnancy confirmed  . Drug use: No  . Sexual activity: Yes    Birth control/protection: None, Injection  Lifestyle  . Physical activity    Days per week: Not on file    Minutes per session: Not on file  . Stress: Not on file  Relationships  . Social Musicianconnections    Talks on phone: Not on file    Gets together: Not on file    Attends religious service: Not on file    Active member of club or organization: Not on file    Attends meetings of clubs or organizations:  Not on file    Relationship status: Not on file  . Intimate partner violence    Fear of current or ex partner: Not on file    Emotionally abused: Not on file    Physically abused: Not on file    Forced sexual activity: Not on file  Other Topics Concern  . Not on file  Social History Narrative  . Not on file    Outpatient Medications Prior to Visit  Medication Sig Dispense Refill  . medroxyPROGESTERone Acetate 150 MG/ML SUSY Inject 1 mL (150 mg total) into the muscle every 3 (three) months. 1 mL 3   No facility-administered medications prior to visit.       ROS:  Review of Systems  Constitutional: Negative for fever.  Gastrointestinal: Negative for blood in stool, constipation, diarrhea, nausea and vomiting.  Genitourinary: Positive for dyspareunia, dysuria, vaginal bleeding and vaginal  discharge. Negative for flank pain, frequency, hematuria, urgency and vaginal pain.  Musculoskeletal: Negative for back pain.  Skin: Negative for rash.   BREAST: No symptoms   OBJECTIVE:   Vitals:  BP 110/70   Ht 5\' 4"  (1.626 m)   Wt 124 lb (56.2 kg)   Breastfeeding No   BMI 21.28 kg/m   Physical Exam Vitals signs reviewed.  Constitutional:      Appearance: She is well-developed.  Neck:     Musculoskeletal: Normal range of motion.  Pulmonary:     Effort: Pulmonary effort is normal.  Genitourinary:    General: Normal vulva.     Pubic Area: No rash.      Labia:        Right: No rash, tenderness or lesion.        Left: No rash, tenderness or lesion.      Vagina: Normal. No vaginal discharge, erythema or tenderness.     Cervix: Normal.     Uterus: Normal. Not enlarged and not tender.      Adnexa: Right adnexa normal and left adnexa normal.       Right: No mass or tenderness.         Left: No mass or tenderness.       Comments: NEG EXT EXAM EXCEPT SMALL ERYTHEMATOUS PATCH POST FOURCHETTE; AREA TENDER DURING SEX Musculoskeletal: Normal range of motion.  Skin:    General: Skin is warm and dry.  Neurological:     General: No focal deficit present.     Mental Status: She is alert and oriented to person, place, and time.  Psychiatric:        Mood and Affect: Mood normal.        Behavior: Behavior normal.        Thought Content: Thought content normal.        Judgment: Judgment normal.     Results: Results for orders placed or performed in visit on 07/06/19 (from the past 24 hour(s))  POCT Wet Prep with KOH     Status: Abnormal   Collection Time: 07/06/19  4:56 PM  Result Value Ref Range   Trichomonas, UA Negative    Clue Cells Wet Prep HPF POC pos    Epithelial Wet Prep HPF POC     Yeast Wet Prep HPF POC neg    Bacteria Wet Prep HPF POC     RBC Wet Prep HPF POC     WBC Wet Prep HPF POC     KOH Prep POC Positive (A) Negative     Assessment/Plan: Bacterial  vaginosis -  Plan: POCT Wet Prep with KOH, metroNIDAZOLE (FLAGYL) 500 MG tablet; Pos sx/wet prep. Rx flagyl. No EtOH. F/u prn.   Vaginal discharge - Plan: POCT Wet Prep with KOH, Cervicovaginal ancillary only, check yeast on swab. Will f/u with results.   Screening for STD (sexually transmitted disease) - Plan: Cervicovaginal ancillary only; will call with results.   Dyspareunia--see if sx improve with BV tx. Rule out yeast vag on culture. Dove sens skin soap. F/u prn.   BTB with depo--check STDs. If neg, reassurance. Can do short course ERT prn.   Meds ordered this encounter  Medications  . metroNIDAZOLE (FLAGYL) 500 MG tablet    Sig: Take 1 tablet (500 mg total) by mouth 2 (two) times daily for 7 days.    Dispense:  14 tablet    Refill:  0    Order Specific Question:   Supervising Provider    Answer:   Nadara Mustard [[id number]]      Return if symptoms worsen or fail to improve.   B. , PA-C 07/06/2019 4:59 PM

## 2019-07-06 NOTE — Telephone Encounter (Signed)
Patient is schedule for 06/06/19 with ABC

## 2019-07-06 NOTE — Patient Instructions (Signed)
I value your feedback and entrusting us with your care. If you get a [facility] patient survey, I would appreciate you taking the time to let us know about your experience today. Thank you! 

## 2019-07-10 LAB — CERVICOVAGINAL ANCILLARY ONLY
Candida Glabrata: NEGATIVE
Candida Vaginitis: NEGATIVE
Chlamydia: NEGATIVE
Comment: NEGATIVE
Comment: NEGATIVE
Comment: NEGATIVE
Comment: NEGATIVE
Comment: NORMAL
Neisseria Gonorrhea: NEGATIVE
Trichomonas: NEGATIVE

## 2019-07-13 ENCOUNTER — Telehealth: Payer: Self-pay | Admitting: Obstetrics and Gynecology

## 2019-07-13 NOTE — Telephone Encounter (Signed)
Pt aware of neg STD testing/yeast on culture. Vag sx resolved with BV tx. Dyspareunia slightly improved. Having BTB on depo. Offered short course of ERT, but pt prefers to wait for now. Will f/u prn.

## 2019-08-28 ENCOUNTER — Ambulatory Visit: Payer: BLUE CROSS/BLUE SHIELD

## 2019-08-31 ENCOUNTER — Ambulatory Visit: Payer: BLUE CROSS/BLUE SHIELD

## 2019-08-31 ENCOUNTER — Other Ambulatory Visit: Payer: Self-pay

## 2019-08-31 ENCOUNTER — Ambulatory Visit (INDEPENDENT_AMBULATORY_CARE_PROVIDER_SITE_OTHER): Payer: BLUE CROSS/BLUE SHIELD

## 2019-08-31 DIAGNOSIS — Z3042 Encounter for surveillance of injectable contraceptive: Secondary | ICD-10-CM | POA: Diagnosis not present

## 2019-08-31 MED ORDER — MEDROXYPROGESTERONE ACETATE 150 MG/ML IM SUSP
150.0000 mg | Freq: Once | INTRAMUSCULAR | Status: AC
  Administered 2019-08-31: 150 mg via INTRAMUSCULAR

## 2019-08-31 NOTE — Progress Notes (Signed)
Pt here for depo which was given IM right glut.  NDC# [address]-2 

## 2019-09-29 DIAGNOSIS — F1020 Alcohol dependence, uncomplicated: Secondary | ICD-10-CM | POA: Diagnosis not present

## 2019-09-29 DIAGNOSIS — F429 Obsessive-compulsive disorder, unspecified: Secondary | ICD-10-CM | POA: Diagnosis not present

## 2019-10-05 DIAGNOSIS — F1020 Alcohol dependence, uncomplicated: Secondary | ICD-10-CM | POA: Diagnosis not present

## 2019-10-05 DIAGNOSIS — F429 Obsessive-compulsive disorder, unspecified: Secondary | ICD-10-CM | POA: Diagnosis not present

## 2019-10-13 DIAGNOSIS — F429 Obsessive-compulsive disorder, unspecified: Secondary | ICD-10-CM | POA: Diagnosis not present

## 2019-10-13 DIAGNOSIS — F1020 Alcohol dependence, uncomplicated: Secondary | ICD-10-CM | POA: Diagnosis not present

## 2019-10-14 DIAGNOSIS — F1020 Alcohol dependence, uncomplicated: Secondary | ICD-10-CM | POA: Diagnosis not present

## 2019-10-14 DIAGNOSIS — F429 Obsessive-compulsive disorder, unspecified: Secondary | ICD-10-CM | POA: Diagnosis not present

## 2019-10-19 DIAGNOSIS — F1020 Alcohol dependence, uncomplicated: Secondary | ICD-10-CM | POA: Diagnosis not present

## 2019-10-19 DIAGNOSIS — F429 Obsessive-compulsive disorder, unspecified: Secondary | ICD-10-CM | POA: Diagnosis not present

## 2019-10-22 DIAGNOSIS — F339 Major depressive disorder, recurrent, unspecified: Secondary | ICD-10-CM | POA: Diagnosis not present

## 2019-10-22 DIAGNOSIS — F1020 Alcohol dependence, uncomplicated: Secondary | ICD-10-CM | POA: Diagnosis not present

## 2019-10-26 DIAGNOSIS — Z20828 Contact with and (suspected) exposure to other viral communicable diseases: Secondary | ICD-10-CM | POA: Diagnosis not present

## 2019-11-05 DIAGNOSIS — F429 Obsessive-compulsive disorder, unspecified: Secondary | ICD-10-CM | POA: Diagnosis not present

## 2019-11-05 DIAGNOSIS — F1020 Alcohol dependence, uncomplicated: Secondary | ICD-10-CM | POA: Diagnosis not present

## 2019-11-10 DIAGNOSIS — F1020 Alcohol dependence, uncomplicated: Secondary | ICD-10-CM | POA: Diagnosis not present

## 2019-11-10 DIAGNOSIS — F429 Obsessive-compulsive disorder, unspecified: Secondary | ICD-10-CM | POA: Diagnosis not present

## 2019-11-13 DIAGNOSIS — F1020 Alcohol dependence, uncomplicated: Secondary | ICD-10-CM | POA: Diagnosis not present

## 2019-11-13 DIAGNOSIS — F429 Obsessive-compulsive disorder, unspecified: Secondary | ICD-10-CM | POA: Diagnosis not present

## 2019-11-16 DIAGNOSIS — F429 Obsessive-compulsive disorder, unspecified: Secondary | ICD-10-CM | POA: Diagnosis not present

## 2019-11-16 DIAGNOSIS — F1020 Alcohol dependence, uncomplicated: Secondary | ICD-10-CM | POA: Diagnosis not present

## 2019-11-24 ENCOUNTER — Ambulatory Visit (INDEPENDENT_AMBULATORY_CARE_PROVIDER_SITE_OTHER): Payer: BC Managed Care – PPO

## 2019-11-24 ENCOUNTER — Other Ambulatory Visit: Payer: Self-pay

## 2019-11-24 DIAGNOSIS — Z3042 Encounter for surveillance of injectable contraceptive: Secondary | ICD-10-CM | POA: Diagnosis not present

## 2019-11-24 MED ORDER — MEDROXYPROGESTERONE ACETATE 150 MG/ML IM SUSP
150.0000 mg | Freq: Once | INTRAMUSCULAR | Status: AC
  Administered 2019-11-24: 150 mg via INTRAMUSCULAR

## 2019-11-24 NOTE — Progress Notes (Signed)
Patient presents today for Depo Provera injection within dates. Given IM LUOQ. Patient tolerated well. 

## 2019-11-26 DIAGNOSIS — F1020 Alcohol dependence, uncomplicated: Secondary | ICD-10-CM | POA: Diagnosis not present

## 2019-11-26 DIAGNOSIS — F429 Obsessive-compulsive disorder, unspecified: Secondary | ICD-10-CM | POA: Diagnosis not present

## 2019-12-02 DIAGNOSIS — Z23 Encounter for immunization: Secondary | ICD-10-CM | POA: Diagnosis not present

## 2019-12-03 DIAGNOSIS — F429 Obsessive-compulsive disorder, unspecified: Secondary | ICD-10-CM | POA: Diagnosis not present

## 2019-12-03 DIAGNOSIS — F1020 Alcohol dependence, uncomplicated: Secondary | ICD-10-CM | POA: Diagnosis not present

## 2019-12-11 DIAGNOSIS — F1020 Alcohol dependence, uncomplicated: Secondary | ICD-10-CM | POA: Diagnosis not present

## 2019-12-11 DIAGNOSIS — F429 Obsessive-compulsive disorder, unspecified: Secondary | ICD-10-CM | POA: Diagnosis not present

## 2019-12-17 DIAGNOSIS — F1020 Alcohol dependence, uncomplicated: Secondary | ICD-10-CM | POA: Diagnosis not present

## 2019-12-17 DIAGNOSIS — F429 Obsessive-compulsive disorder, unspecified: Secondary | ICD-10-CM | POA: Diagnosis not present

## 2019-12-19 DIAGNOSIS — F429 Obsessive-compulsive disorder, unspecified: Secondary | ICD-10-CM | POA: Diagnosis not present

## 2019-12-19 DIAGNOSIS — F1020 Alcohol dependence, uncomplicated: Secondary | ICD-10-CM | POA: Diagnosis not present

## 2019-12-21 DIAGNOSIS — F1020 Alcohol dependence, uncomplicated: Secondary | ICD-10-CM | POA: Diagnosis not present

## 2019-12-21 DIAGNOSIS — F429 Obsessive-compulsive disorder, unspecified: Secondary | ICD-10-CM | POA: Diagnosis not present

## 2019-12-22 DIAGNOSIS — F429 Obsessive-compulsive disorder, unspecified: Secondary | ICD-10-CM | POA: Diagnosis not present

## 2019-12-22 DIAGNOSIS — F1020 Alcohol dependence, uncomplicated: Secondary | ICD-10-CM | POA: Diagnosis not present

## 2019-12-23 DIAGNOSIS — F429 Obsessive-compulsive disorder, unspecified: Secondary | ICD-10-CM | POA: Diagnosis not present

## 2019-12-23 DIAGNOSIS — F1020 Alcohol dependence, uncomplicated: Secondary | ICD-10-CM | POA: Diagnosis not present

## 2019-12-24 DIAGNOSIS — F1020 Alcohol dependence, uncomplicated: Secondary | ICD-10-CM | POA: Diagnosis not present

## 2019-12-24 DIAGNOSIS — F429 Obsessive-compulsive disorder, unspecified: Secondary | ICD-10-CM | POA: Diagnosis not present

## 2019-12-26 DIAGNOSIS — F429 Obsessive-compulsive disorder, unspecified: Secondary | ICD-10-CM | POA: Diagnosis not present

## 2019-12-26 DIAGNOSIS — F1020 Alcohol dependence, uncomplicated: Secondary | ICD-10-CM | POA: Diagnosis not present

## 2019-12-28 DIAGNOSIS — F1020 Alcohol dependence, uncomplicated: Secondary | ICD-10-CM | POA: Diagnosis not present

## 2019-12-28 DIAGNOSIS — F429 Obsessive-compulsive disorder, unspecified: Secondary | ICD-10-CM | POA: Diagnosis not present

## 2019-12-29 DIAGNOSIS — F429 Obsessive-compulsive disorder, unspecified: Secondary | ICD-10-CM | POA: Diagnosis not present

## 2019-12-29 DIAGNOSIS — F1020 Alcohol dependence, uncomplicated: Secondary | ICD-10-CM | POA: Diagnosis not present

## 2019-12-31 DIAGNOSIS — Z79899 Other long term (current) drug therapy: Secondary | ICD-10-CM | POA: Diagnosis not present

## 2020-01-04 DIAGNOSIS — Z23 Encounter for immunization: Secondary | ICD-10-CM | POA: Diagnosis not present

## 2020-01-05 DIAGNOSIS — F429 Obsessive-compulsive disorder, unspecified: Secondary | ICD-10-CM | POA: Diagnosis not present

## 2020-01-05 DIAGNOSIS — F1020 Alcohol dependence, uncomplicated: Secondary | ICD-10-CM | POA: Diagnosis not present

## 2020-01-14 DIAGNOSIS — F429 Obsessive-compulsive disorder, unspecified: Secondary | ICD-10-CM | POA: Diagnosis not present

## 2020-01-14 DIAGNOSIS — F1020 Alcohol dependence, uncomplicated: Secondary | ICD-10-CM | POA: Diagnosis not present

## 2020-01-15 DIAGNOSIS — F429 Obsessive-compulsive disorder, unspecified: Secondary | ICD-10-CM | POA: Diagnosis not present

## 2020-01-15 DIAGNOSIS — F1020 Alcohol dependence, uncomplicated: Secondary | ICD-10-CM | POA: Diagnosis not present

## 2020-01-22 DIAGNOSIS — F1020 Alcohol dependence, uncomplicated: Secondary | ICD-10-CM | POA: Diagnosis not present

## 2020-01-22 DIAGNOSIS — F429 Obsessive-compulsive disorder, unspecified: Secondary | ICD-10-CM | POA: Diagnosis not present

## 2020-02-16 ENCOUNTER — Other Ambulatory Visit: Payer: Self-pay

## 2020-02-16 ENCOUNTER — Ambulatory Visit (INDEPENDENT_AMBULATORY_CARE_PROVIDER_SITE_OTHER): Payer: BC Managed Care – PPO

## 2020-02-16 DIAGNOSIS — Z3042 Encounter for surveillance of injectable contraceptive: Secondary | ICD-10-CM | POA: Diagnosis not present

## 2020-02-16 MED ORDER — MEDROXYPROGESTERONE ACETATE 150 MG/ML IM SUSP
150.0000 mg | Freq: Once | INTRAMUSCULAR | Status: AC
  Administered 2020-02-16: 150 mg via INTRAMUSCULAR

## 2020-03-12 ENCOUNTER — Emergency Department
Admission: EM | Admit: 2020-03-12 | Discharge: 2020-03-13 | Disposition: A | Discharge status: Home / Self Care | Payer: BC Managed Care – PPO | Attending: Emergency Medicine | Admitting: Emergency Medicine

## 2020-03-12 ENCOUNTER — Other Ambulatory Visit: Payer: Self-pay

## 2020-03-12 ENCOUNTER — Encounter: Payer: Self-pay | Admitting: Intensive Care

## 2020-03-12 DIAGNOSIS — F10129 Alcohol abuse with intoxication, unspecified: Secondary | ICD-10-CM | POA: Insufficient documentation

## 2020-03-12 DIAGNOSIS — F331 Major depressive disorder, recurrent, moderate: Secondary | ICD-10-CM

## 2020-03-12 DIAGNOSIS — Y908 Blood alcohol level of 240 mg/100 ml or more: Secondary | ICD-10-CM | POA: Insufficient documentation

## 2020-03-12 DIAGNOSIS — F1010 Alcohol abuse, uncomplicated: Secondary | ICD-10-CM

## 2020-03-12 DIAGNOSIS — Z79899 Other long term (current) drug therapy: Secondary | ICD-10-CM | POA: Insufficient documentation

## 2020-03-12 DIAGNOSIS — F10920 Alcohol use, unspecified with intoxication, uncomplicated: Secondary | ICD-10-CM

## 2020-03-12 DIAGNOSIS — F320 Major depressive disorder, single episode, mild: Secondary | ICD-10-CM | POA: Diagnosis not present

## 2020-03-12 LAB — CBC
HCT: 42.9 % (ref 36.0–46.0)
Hemoglobin: 15.1 g/dL — ABNORMAL HIGH (ref 12.0–15.0)
MCH: 32.8 pg (ref 26.0–34.0)
MCHC: 35.2 g/dL (ref 30.0–36.0)
MCV: 93.3 fL (ref 80.0–100.0)
Platelets: 322 10*3/uL (ref 150–400)
RBC: 4.6 MIL/uL (ref 3.87–5.11)
RDW: 12.9 % (ref 11.5–15.5)
WBC: 8.3 10*3/uL (ref 4.0–10.5)
nRBC: 0 % (ref 0.0–0.2)

## 2020-03-12 LAB — URINE DRUG SCREEN, QUALITATIVE (ARMC ONLY)
Amphetamines, Ur Screen: NOT DETECTED
Barbiturates, Ur Screen: NOT DETECTED
Benzodiazepine, Ur Scrn: NOT DETECTED
Cannabinoid 50 Ng, Ur ~~LOC~~: NOT DETECTED
Cocaine Metabolite,Ur ~~LOC~~: NOT DETECTED
MDMA (Ecstasy)Ur Screen: NOT DETECTED
Methadone Scn, Ur: NOT DETECTED
Opiate, Ur Screen: NOT DETECTED
Phencyclidine (PCP) Ur S: NOT DETECTED
Tricyclic, Ur Screen: NOT DETECTED

## 2020-03-12 LAB — COMPREHENSIVE METABOLIC PANEL
ALT: 19 U/L (ref 0–44)
AST: 22 U/L (ref 15–41)
Albumin: 5 g/dL (ref 3.5–5.0)
Alkaline Phosphatase: 76 U/L (ref 38–126)
Anion gap: 11 (ref 5–15)
BUN: 11 mg/dL (ref 6–20)
CO2: 18 mmol/L — ABNORMAL LOW (ref 22–32)
Calcium: 9.8 mg/dL (ref 8.9–10.3)
Chloride: 111 mmol/L (ref 98–111)
Creatinine, Ser: 1 mg/dL (ref 0.44–1.00)
GFR calc Af Amer: >60 mL/min (ref >60)
GFR calc non Af Amer: >60 mL/min (ref >60)
Glucose, Bld: 92 mg/dL (ref 70–99)
Potassium: 3.6 mmol/L (ref 3.5–5.1)
Sodium: 140 mmol/L (ref 135–145)
Total Bilirubin: 0.9 mg/dL (ref 0.3–1.2)
Total Protein: 8 g/dL (ref 6.5–8.1)

## 2020-03-12 LAB — ETHANOL: Alcohol, Ethyl (B): 299 mg/dL — ABNORMAL HIGH (ref <10)

## 2020-03-12 LAB — ACETAMINOPHEN LEVEL: Acetaminophen (Tylenol), Serum: <10 ug/mL — ABNORMAL LOW (ref 10–30)

## 2020-03-12 LAB — SALICYLATE LEVEL: Salicylate Lvl: <7 mg/dL — ABNORMAL LOW (ref 7.0–30.0)

## 2020-03-12 MED ORDER — LORAZEPAM 2 MG PO TABS
0.0000 mg | ORAL_TABLET | Freq: Four times a day (QID) | ORAL | Status: DC
  Administered 2020-03-13: 1 mg via ORAL
  Filled 2020-03-12: qty 1

## 2020-03-12 MED ORDER — THIAMINE HCL 100 MG/ML IJ SOLN
Freq: Once | INTRAVENOUS | Status: AC
  Filled 2020-03-12: qty 1000

## 2020-03-12 MED ORDER — LORAZEPAM 2 MG/ML IJ SOLN
0.0000 mg | Freq: Four times a day (QID) | INTRAMUSCULAR | Status: DC
  Administered 2020-03-13: 2 mg via INTRAVENOUS
  Filled 2020-03-12: qty 1

## 2020-03-12 MED ORDER — THIAMINE HCL 100 MG/ML IJ SOLN: 100.0000 mg | Freq: Every day | INTRAMUSCULAR | Status: DC

## 2020-03-12 MED ORDER — THIAMINE HCL 100 MG PO TABS
100.0000 mg | ORAL_TABLET | Freq: Every day | ORAL | Status: DC
  Administered 2020-03-13: 100 mg via ORAL
  Filled 2020-03-12: qty 1

## 2020-03-12 NOTE — ED Notes (Signed)
Patient belongings: rainbow sandals, green tshirt, black shorts, brown hair clip, 1 pair of diamond earrings, 1 pair of pearl earrings, . Cell phone with pink phone case, purse with SECU card and three credit cards.

## 2020-03-12 NOTE — ED Notes (Signed)
Psych and TTS at bedside. 
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
## 2020-03-13 DIAGNOSIS — F1010 Alcohol abuse, uncomplicated: Secondary | ICD-10-CM | POA: Diagnosis not present

## 2020-03-13 MED ORDER — LORAZEPAM 1 MG PO TABS
1.0000 mg | ORAL_TABLET | Freq: Once | ORAL | Status: AC
  Administered 2020-03-13: 1 mg via ORAL
  Filled 2020-03-13: qty 1

## 2020-03-13 MED ORDER — CHLORDIAZEPOXIDE HCL 25 MG PO CAPS: ORAL_CAPSULE | ORAL | 0 refills | Status: AC

## 2020-03-13 MED ORDER — SODIUM CHLORIDE 0.9 % IV SOLN: Freq: Once | INTRAVENOUS | Status: AC

## 2020-03-13 NOTE — ED Notes (Signed)
Peripheral IV discontinued. Catheter intact. No signs of infiltration or redness. Gauze applied to IV site.   Discharge instructions reviewed with patient. Questions fielded by this RN. Patient verbalizes understanding of instructions. Patient discharged home in stable condition per [facility]. No acute distress noted at time of discharge.    Pt leaving via [facility], pt has all belongings, pt partnered with for safe detox in treatment facility, dangers of ETOH and benzos discussed and "teachback" used

## 2020-03-13 NOTE — Discharge Instructions (Addendum)
DO NOT DRINK ANY ALCOHOL WHILE TAKING THE LIBRIUM. DOING SO CAN RESULT IN RESPIRATORY DEPRESSION AND DEATH
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
## 2020-03-13 NOTE — ED Provider Notes (Addendum)
Patient cleared by psychiatry. Contracting for safety, clinically sober. She is very interested in stopping drinking and has plans to f/u with outpatient rehab. Given her risk for w/d, risks/benefits discussed and patient given brief librium taper. She understands risks associated with this.   Shaune Pollack, MD 03/13/20 Lynelle Smoke    Shaune Pollack, MD 03/14/20 [phone]
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
## 2020-03-13 NOTE — Final Progress Note (Signed)
Physician Final Progress Note  Patient ID: FENNA SEMEL MRN: [id number] DOB/AGE: August 18, 1986 34 y.o.  Admit date: 03/12/2020 Admitting provider:   None patient going home  Discharge date: 03/13/2020   Admission Diagnoses:   Alcohol intoxication, withdrawal,  dependence   Discharge Diagnoses:  Same   Consults:  TTS ED and Psych   Significant Findings/ Diagnostic Studies:   Procedures:  CIWA protocol    Discharge Condition: stab;e    Disposition:  Home,  Mom to pick her up   Diet:  As tolerated   Discharge Activity:   Back to work she says   AA NA day treatment  Related programming ---group support Women's support groups  individual therapy     Patient discharged in stable condition with IVC rescinded  She says she has no active SI HI or plans  She contracts for safety  She has no withdrawal signs and symptoms    Brief   MS    Oriented to person place date and time  Rapport okay Speech normal  Not clouded or fluctuant Mood stable for now no new frank depression or anxiety  No frank psychosis or mania No shakes or tremors Concentration and attention normal   Total time spent taking care of this patient:  25 minutes     Signed: Roselind Messier 03/13/2020, 3:54 PM

## 2020-03-13 NOTE — ED Notes (Signed)
Meal tray given 

## 2020-04-29 DIAGNOSIS — Z20822 Contact with and (suspected) exposure to covid-19: Secondary | ICD-10-CM | POA: Diagnosis not present

## 2020-05-10 ENCOUNTER — Ambulatory Visit: Payer: BC Managed Care – PPO | Admitting: Obstetrics and Gynecology

## 2020-05-11 ENCOUNTER — Other Ambulatory Visit: Payer: Self-pay | Admitting: Obstetrics and Gynecology

## 2020-05-14 DIAGNOSIS — J0110 Acute frontal sinusitis, unspecified: Secondary | ICD-10-CM | POA: Diagnosis not present

## 2020-05-14 DIAGNOSIS — Z20822 Contact with and (suspected) exposure to covid-19: Secondary | ICD-10-CM | POA: Diagnosis not present

## 2020-05-14 DIAGNOSIS — R05 Cough: Secondary | ICD-10-CM | POA: Diagnosis not present

## 2020-05-23 ENCOUNTER — Telehealth: Payer: Self-pay

## 2020-05-23 ENCOUNTER — Other Ambulatory Visit: Payer: Self-pay

## 2020-05-23 MED ORDER — MEDROXYPROGESTERONE ACETATE 150 MG/ML IM SUSY: 150.0000 mg | PREFILLED_SYRINGE | INTRAMUSCULAR | 0 refills | Status: DC

## 2020-05-23 NOTE — Telephone Encounter (Signed)
Pt calling to get a refill on Depo. She has  Not been seen in over a year, so sending to [facility] nurse to make sure this is ok. [facility]

## 2020-05-23 NOTE — Telephone Encounter (Signed)
Done

## 2020-05-23 NOTE — Telephone Encounter (Signed)
Scheduled annual with AMS

## 2020-06-20 ENCOUNTER — Ambulatory Visit: Payer: Medicaid Other | Admitting: Obstetrics and Gynecology

## 2020-08-13 ENCOUNTER — Other Ambulatory Visit: Payer: Self-pay | Admitting: Obstetrics and Gynecology

## 2020-08-29 DIAGNOSIS — F331 Major depressive disorder, recurrent, moderate: Secondary | ICD-10-CM | POA: Diagnosis not present

## 2020-10-16 DIAGNOSIS — Z20822 Contact with and (suspected) exposure to covid-19: Secondary | ICD-10-CM | POA: Diagnosis not present

## 2020-11-23 ENCOUNTER — Telehealth: Payer: Self-pay

## 2020-11-23 NOTE — Telephone Encounter (Signed)
Pt aware of ABC's notes: Depo start will have to be with menses, so unless she is having period at her appt, we'll just send in depo Rx at her appt.  Pls discuss with pt. Thx.

## 2020-12-13 DIAGNOSIS — N870 Mild cervical dysplasia: Secondary | ICD-10-CM | POA: Insufficient documentation

## 2020-12-13 NOTE — Patient Instructions (Incomplete)
I value your feedback and you entrusting us with your care. If you get a [place] patient survey, I would appreciate you taking the time to let us know about your experience today. Thank you! ? ? ?

## 2020-12-13 NOTE — Progress Notes (Deleted)
PCP:  Nonda Lou, MD   No chief complaint on file.    HPI:      [name] is a 35 y.o. G1P0101 whose LMP was No LMP recorded. Patient has had an injection., presents today for her annual examination.  Her menses are {norm/abn:715}, lasting {number:22536} days.  Dysmenorrhea {dysmen:716}. She {does:18564} have intermenstrual bleeding.  Sex activity: {sex active:315163}.  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 There is no FH of ovarian cancer. The patient {does:18564} do self-breast exams.  Tobacco use: {tob:20664} Alcohol use: {Alcohol:11675} No drug use.  Exercise: {exercise:31265}  She {does:18564} get adequate calcium and Vitamin D in her diet.   The pregnancy intention screening data noted above was reviewed. Potential methods of contraception were discussed. The patient elected to proceed with {Upstream End Methods:24109}.     Past Medical History:  Diagnosis Date  . Atypical squamous cell changes of undetermined significance (ASCUS) on cervical cytology with negative high risk human papilloma virus (HPV) test result 11/17/2014  . Bulimia nervosa   . Bunion 2001; 2006; 2007   BILATERAL  . Chlamydia 11/17/2014  . Depression   . Heart murmur    SEPTAL DEFECT  . 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 CESAREAN SECTION N/A 01/25/2017   Procedure: CESAREAN SECTION;  Surgeon: Vena Austria, MD;  Location: [facility] ORS;  Service: Obstetrics;  Laterality: N/A;  . COLPOPEXY  08/08/2016   benign endocervical tissue with focally decidualized stroma compatible with progestin effect  . FOOT SURGERY  2001; 2006; 2007   left and right foot for bunion; two on the right and one on the left - between ages 10 & 62.    Family History  Problem  Relation Age of Onset  . Diabetes Father   . Alcoholism Paternal Aunt   . Alcoholism Maternal Grandfather   . Cancer Paternal Grandfather   . Breast cancer Neg Hx     Social History   Socioeconomic History  . Marital status: Divorced    Spouse name: Not on file  . Number of children: Not on file  . Years of education: Not on file  . Highest education level: Not on file  Occupational History  . Not on file  Tobacco Use  . Smoking status: Never Smoker  . Smokeless tobacco: Never Used  Substance and Sexual Activity  . Alcohol use: Yes    Comment: occ  . Drug use: No  . Sexual activity: Yes    Birth control/protection: None, Injection  Other Topics Concern  . Not on file  Social History Narrative  . Not on file   Social Determinants of Health   Financial Resource Strain: Not on file  Food Insecurity: Not on file  Transportation Needs: Not on file  Physical Activity: Not on file  Stress: Not on file  Social Connections: Not on file  Intimate Partner Violence: Not on file     Current Outpatient Medications:  .  medroxyPROGESTERone Acetate 150 MG/ML SUSY, INJECT 1 ML (150 MG TOTAL) INTO THE MUSCLE EVERY 3 (THREE) MONTHS., Disp: 1 mL, Rfl: 3     ROS:  Review of Systems BREAST: No symptoms   Objective: There were no vitals taken for this  visit.   OBGyn Exam  Results: No results found for this or any previous visit (from the past 24 hour(s)).  Assessment/Plan: No diagnosis found.  No orders of the defined types were placed in this encounter.            GYN counsel {counseling:16159}     F/U  No follow-ups on file.  Morgyn Marut B. Bee Marchiano, PA-C 12/13/2020 4:03 PM

## 2020-12-14 ENCOUNTER — Ambulatory Visit: Payer: Medicaid Other | Admitting: Obstetrics and Gynecology

## 2020-12-14 DIAGNOSIS — Z1151 Encounter for screening for human papillomavirus (HPV): Secondary | ICD-10-CM

## 2020-12-14 DIAGNOSIS — Z124 Encounter for screening for malignant neoplasm of cervix: Secondary | ICD-10-CM

## 2020-12-14 DIAGNOSIS — N870 Mild cervical dysplasia: Secondary | ICD-10-CM

## 2020-12-14 DIAGNOSIS — Z01419 Encounter for gynecological examination (general) (routine) without abnormal findings: Secondary | ICD-10-CM

## 2021-02-23 ENCOUNTER — Other Ambulatory Visit: Payer: Self-pay

## 2021-02-23 ENCOUNTER — Emergency Department
Admission: EM | Admit: 2021-02-23 | Discharge: 2021-02-23 | Disposition: A | Discharge status: Home / Self Care | Payer: No Typology Code available for payment source | Attending: Emergency Medicine | Admitting: Emergency Medicine

## 2021-02-23 DIAGNOSIS — F1020 Alcohol dependence, uncomplicated: Secondary | ICD-10-CM | POA: Diagnosis present

## 2021-02-23 DIAGNOSIS — Y908 Blood alcohol level of 240 mg/100 ml or more: Secondary | ICD-10-CM | POA: Insufficient documentation

## 2021-02-23 DIAGNOSIS — F1010 Alcohol abuse, uncomplicated: Secondary | ICD-10-CM | POA: Diagnosis present

## 2021-02-23 LAB — CBC WITH DIFFERENTIAL/PLATELET
Abs Immature Granulocytes: 0.02 10*3/uL (ref 0.00–0.07)
Basophils Absolute: 0.1 10*3/uL (ref 0.0–0.1)
Basophils Relative: 1 %
Eosinophils Absolute: 0.1 10*3/uL (ref 0.0–0.5)
Eosinophils Relative: 1 %
HCT: 41.2 % (ref 36.0–46.0)
Hemoglobin: 14.8 g/dL (ref 12.0–15.0)
Immature Granulocytes: 0 %
Lymphocytes Relative: 39 %
Lymphs Abs: 3 10*3/uL (ref 0.7–4.0)
MCH: 32.5 pg (ref 26.0–34.0)
MCHC: 35.9 g/dL (ref 30.0–36.0)
MCV: 90.5 fL (ref 80.0–100.0)
Monocytes Absolute: 0.7 10*3/uL (ref 0.1–1.0)
Monocytes Relative: 9 %
Neutro Abs: 3.9 10*3/uL (ref 1.7–7.7)
Neutrophils Relative %: 50 %
Platelets: 290 10*3/uL (ref 150–400)
RBC: 4.55 MIL/uL (ref 3.87–5.11)
RDW: 12.8 % (ref 11.5–15.5)
WBC: 7.7 10*3/uL (ref 4.0–10.5)
nRBC: 0 % (ref 0.0–0.2)

## 2021-02-23 LAB — COMPREHENSIVE METABOLIC PANEL
ALT: 28 U/L (ref 0–44)
AST: 34 U/L (ref 15–41)
Albumin: 5.1 g/dL — ABNORMAL HIGH (ref 3.5–5.0)
Alkaline Phosphatase: 84 U/L (ref 38–126)
Anion gap: 12 (ref 5–15)
BUN: 13 mg/dL (ref 6–20)
CO2: 25 mmol/L (ref 22–32)
Calcium: 8.9 mg/dL (ref 8.9–10.3)
Chloride: 101 mmol/L (ref 98–111)
Creatinine, Ser: 0.93 mg/dL (ref 0.44–1.00)
GFR, Estimated: >60 mL/min (ref >60)
Glucose, Bld: 96 mg/dL (ref 70–99)
Potassium: 3.7 mmol/L (ref 3.5–5.1)
Sodium: 138 mmol/L (ref 135–145)
Total Bilirubin: 0.8 mg/dL (ref 0.3–1.2)
Total Protein: 7.7 g/dL (ref 6.5–8.1)

## 2021-02-23 LAB — MAGNESIUM: Magnesium: 2 mg/dL (ref 1.7–2.4)

## 2021-02-23 LAB — POC URINE PREG, ED: Preg Test, Ur: NEGATIVE

## 2021-02-23 LAB — ETHANOL: Alcohol, Ethyl (B): 254 mg/dL — ABNORMAL HIGH (ref <10)

## 2021-02-23 MED ORDER — CHLORDIAZEPOXIDE HCL 25 MG PO CAPS: 25.0000 mg | ORAL_CAPSULE | Freq: Two times a day (BID) | ORAL | 0 refills | Status: DC | PRN

## 2021-02-23 NOTE — Consult Note (Signed)
[facility] Face-to-Face Psychiatry Consult   Reason for Consult: Consult for this 35 year old woman who came to the emergency room because of relapse and alcohol abuse Referring Physician: Derrill Kay Patient Identification: Monique Kaufman MRN:  [id number] Principal Diagnosis: Alcohol use disorder, severe, dependence (HCC) Diagnosis:  Principal Problem:   Alcohol use disorder, severe, dependence (HCC)   Total Time spent with patient: 1 hour  Subjective:   Monique Kaufman is a 35 y.o. female patient admitted with "I need to stop this".  HPI: Patient seen chart reviewed.  35 year old woman with a history of alcohol abuse comes to the emergency room intoxicated.  Patient states that she relapsed this past Sunday and has been drinking steadily since then.  Drinks mostly wine.  Cannot even estimate how much today certainly more than a bottle.  Prior to that had been sober for 30 days.  Patient denies use of any other drugs.  Mood is anxious and she is down on herself because of her relapse but she is lucid and not having any hallucinations and she denies any suicidal thoughts.  Focuses on the positive of her child and her family and her health as things she wants to improve.  Patient is prescribed psychiatric medicines specifically Zoloft for depression but has not taken it for the past week.  Currently she denies any hallucinations.  She denies any history of seizures or DTs in the past.  Past Psychiatric History: Past history of alcohol abuse which she says got really bad around 2016.  She has had intermittent periods of sobriety.  Several admissions to rehab programs.  No history of seizures or delirium tremens.  Has been prescribed naltrexone in the past but does not take it outside of the hospital.  Currently goes to meetings with AA regularly.  Risk to Self:   Risk to Others:   Prior Inpatient Therapy:   Prior Outpatient Therapy:    Past Medical History:  Past Medical History:  Diagnosis Date  .  Atypical squamous cell changes of undetermined significance (ASCUS) on cervical cytology with negative high risk human papilloma virus (HPV) test result 11/17/2014  . Bulimia nervosa   . Bunion 2001; 2006; 2007   BILATERAL  . Chlamydia 11/17/2014  . Depression   . Heart murmur    SEPTAL DEFECT  . 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 CESAREAN SECTION N/A 01/25/2017   Procedure: CESAREAN SECTION;  Surgeon: Vena Austria, MD;  Location: [facility] ORS;  Service: Obstetrics;  Laterality: N/A;  . COLPOPEXY  08/08/2016   benign endocervical tissue with focally decidualized stroma compatible with progestin effect  . FOOT SURGERY  2001; 2006; 2007   left and right foot for bunion; two on the right and one on the left - between ages 82 & 44.   Family History:  Family History  Problem Relation Age of Onset  . Diabetes Father   . Alcoholism Paternal Aunt   . Alcoholism Maternal Grandfather   . Cancer Paternal Grandfather   . Breast cancer Neg Hx    Family Psychiatric  History: Positive for several individuals with alcohol abuse Social History:  Social History   Substance and Sexual Activity  Alcohol Use Yes   Comment: occ     Social History   Substance and Sexual Activity  Drug Use No    Social History   Socioeconomic History  . Marital status: Divorced  Spouse name: Not on file  . Number of children: Not on file  . Years of education: Not on file  . Highest education level: Not on file  Occupational History  . Not on file  Tobacco Use  . Smoking status: Never Smoker  . Smokeless tobacco: Never Used  Substance and Sexual Activity  . Alcohol use: Yes    Comment: occ  . Drug use: No  . Sexual activity: Yes    Birth control/protection: None, Injection  Other Topics Concern  . Not on file  Social History Narrative  . Not on file   Social Determinants of Health   Financial Resource Strain: Not on  file  Food Insecurity: Not on file  Transportation Needs: Not on file  Physical Activity: Not on file  Stress: Not on file  Social Connections: Not on file   Additional Social History:    Allergies:  No Known Allergies  Labs:  Results for orders placed or performed during the hospital encounter of 02/23/21 (from the past 48 hour(s))  Magnesium     Status: None   Collection Time: 02/23/21  3:00 PM  Result Value Ref Range   Magnesium 2.0 1.7 - 2.4 mg/dL    Comment: Performed at [facility], [address] [place], [place] [address]  POC Urine Pregnancy, ED     Status: None   Collection Time: 02/23/21  3:02 PM  Result Value Ref Range   Preg Test, Ur NEGATIVE NEGATIVE    Comment:        THE SENSITIVITY OF THIS METHODOLOGY IS >24 mIU/mL   CBC with Differential     Status: None   Collection Time: 02/23/21  3:10 PM  Result Value Ref Range   WBC 7.7 4.0 - 10.5 K/uL   RBC 4.55 3.87 - 5.11 MIL/uL   Hemoglobin 14.8 12.0 - 15.0 g/dL   HCT 47.841.2 29.536.0 - 62.146.0 %   MCV 90.5 80.0 - 100.0 fL   MCH 32.5 26.0 - 34.0 pg   MCHC 35.9 30.0 - 36.0 g/dL   RDW 30.812.8 65.711.5 - 84.615.5 %   Platelets 290 150 - 400 K/uL   nRBC 0.0 0.0 - 0.2 %   Neutrophils Relative % 50 %   Neutro Abs 3.9 1.7 - 7.7 K/uL   Lymphocytes Relative 39 %   Lymphs Abs 3.0 0.7 - 4.0 K/uL   Monocytes Relative 9 %   Monocytes Absolute 0.7 0.1 - 1.0 K/uL   Eosinophils Relative 1 %   Eosinophils Absolute 0.1 0.0 - 0.5 K/uL   Basophils Relative 1 %   Basophils Absolute 0.1 0.0 - 0.1 K/uL   Immature Granulocytes 0 %   Abs Immature Granulocytes 0.02 0.00 - 0.07 K/uL    Comment: Performed at [facility], [address] [place], [place] [address]  Comprehensive metabolic panel     Status: Abnormal   Collection Time: 02/23/21  3:10 PM  Result Value Ref Range   Sodium 138 135 - 145 mmol/L   Potassium 3.7 3.5 - 5.1 mmol/L   Chloride 101 98 - 111 mmol/L   CO2 25 22 - 32 mmol/L   Glucose, Bld 96 70 - 99 mg/dL     Comment: Glucose reference range applies only to samples taken after fasting for at least 8 hours.   BUN 13 6 - 20 mg/dL   Creatinine, Ser 2.840.93 0.44 - 1.00 mg/dL   Calcium 8.9 8.9 - 13.210.3 mg/dL   Total Protein 7.7  6.5 - 8.1 g/dL   Albumin 5.1 (H) 3.5 - 5.0 g/dL   AST 34 15 - 41 U/L   ALT 28 0 - 44 U/L   Alkaline Phosphatase 84 38 - 126 U/L   Total Bilirubin 0.8 0.3 - 1.2 mg/dL   GFR, Estimated >41 >66 mL/min    Comment: (NOTE) Calculated using the CKD-EPI Creatinine Equation (2021)    Anion gap 12 5 - 15    Comment: Performed at [facility], [address] [place], [place] [address]  Ethanol     Status: Abnormal   Collection Time: 02/23/21  3:10 PM  Result Value Ref Range   Alcohol, Ethyl (B) 254 (H) <10 mg/dL    Comment: (NOTE) Lowest detectable limit for serum alcohol is 10 mg/dL.  For medical purposes only. Performed at [facility], [address] [place], [place] [address]     No current facility-administered medications for this encounter.   Current Outpatient Medications  Medication Sig Dispense Refill  . chlordiazePOXIDE (LIBRIUM) 25 MG capsule Take 1 capsule (25 mg total) by mouth 2 (two) times daily as needed for anxiety or withdrawal. Start tonight after going home. 4 capsule 0  . medroxyPROGESTERone Acetate 150 MG/ML SUSY INJECT 1 ML (150 MG TOTAL) INTO THE MUSCLE EVERY 3 (THREE) MONTHS. 1 mL 3    Musculoskeletal: Strength & Muscle Tone: within normal limits Gait & Station: normal Patient leans: N/A            Psychiatric Specialty Exam:  Presentation  General Appearance: No data recorded Eye Contact:No data recorded Speech:No data recorded Speech Volume:No data recorded Handedness:No data recorded  Mood and Affect  Mood:No data recorded Affect:No data recorded  Thought Process  Thought Processes:No data recorded Descriptions of Associations:No data recorded Orientation:No data recorded Thought Content:No data  recorded History of Schizophrenia/Schizoaffective disorder:No data recorded Duration of Psychotic Symptoms:No data recorded Hallucinations:No data recorded Ideas of Reference:No data recorded Suicidal Thoughts:No data recorded Homicidal Thoughts:No data recorded  Sensorium  Memory:No data recorded Judgment:No data recorded Insight:No data recorded  Executive Functions  Concentration:No data recorded Attention Span:No data recorded Recall:No data recorded Fund of Knowledge:No data recorded Language:No data recorded  Psychomotor Activity  Psychomotor Activity:No data recorded  Assets  Assets:No data recorded  Sleep  Sleep:No data recorded  Physical Exam: Physical Exam Vitals and nursing note reviewed.  Constitutional:      Appearance: Normal appearance.  HENT:     Head: Normocephalic and atraumatic.     Mouth/Throat:     Pharynx: Oropharynx is clear.  Eyes:     Pupils: Pupils are equal, round, and reactive to light.  Cardiovascular:     Rate and Rhythm: Normal rate and regular rhythm.  Pulmonary:     Effort: Pulmonary effort is normal.     Breath sounds: Normal breath sounds.  Abdominal:     General: Abdomen is flat.     Palpations: Abdomen is soft.  Musculoskeletal:        General: Normal range of motion.  Skin:    General: Skin is warm and dry.  Neurological:     General: No focal deficit present.     Mental Status: She is alert. Mental status is at baseline.  Psychiatric:        Attention and Perception: Attention normal.        Mood and Affect: Mood is anxious. Affect is tearful.        Speech: Speech is delayed and tangential.  Behavior: Behavior is slowed.        Thought Content: Thought content normal. Thought content is not paranoid or delusional. Thought content does not include homicidal or suicidal ideation.        Judgment: Judgment is impulsive.    Review of Systems  Constitutional: Negative.   HENT: Negative.   Eyes: Negative.    Respiratory: Negative.   Cardiovascular: Negative.   Gastrointestinal: Negative.   Musculoskeletal: Negative.   Skin: Negative.   Neurological: Negative.   Psychiatric/Behavioral: Positive for depression, memory loss and substance abuse. Negative for hallucinations and suicidal ideas. The patient is nervous/anxious and has insomnia.    Blood pressure (!) 163/105, pulse 75, temperature 98.7 F (37.1 C), temperature source Oral, resp. rate 18, height 5\' 4"  (1.626 m), weight 56.7 kg, SpO2 100 %. Body mass index is 21.46 kg/m.  Treatment Plan Summary: Medication management and Plan Discussed options for treatment with the patient.  Offered her the option of our referring her to another inpatient detox or rehab facility whether that be [facility] or or [address] where she has been in the past.  Patient declined this.  She stated she would rather go home.  Discussed the importance of getting back into regular treatment.  I did agree to give her a very short taper of Librium 25 mg twice a day as needed for withdrawal for just 4 pills.  Patient instructed not to misuse it.  Instructed that if she cannot stay sober she should consider coming back to the ER or checking herself into a rehab program.  Patient then after I spoke with her and gave her her prescription abruptly stood up and left the emergency room before she could be given discharge paperwork or referral information about [facility] and outpatient treatment by nursing.  Case reviewed with emergency room physician.  Patient did not meet involuntary commitment criteria.  Disposition: No evidence of imminent risk to self or others at present.   Patient does not meet criteria for psychiatric inpatient admission. Supportive therapy provided about ongoing stressors. Discussed crisis plan, support from social network, calling 911, coming to the Emergency Department, and calling Suicide Hotline.  [facility], MD 02/23/2021 4:41 PM

## 2021-02-23 NOTE — ED Provider Notes (Signed)
[facility] Emergency Department Provider Note   ____________________________________________   I have reviewed the triage vital signs and the nursing notes.   HISTORY  Chief Complaint Alcohol Problem   History limited by: Not Limited   HPI Monique Kaufman is a 35 y.o. female who presents to the emergency department today because of concerns for alcohol abuse.  Patient states she has a history of alcoholism and has been to rehab multiple times.  Most recently she has been sober for roughly 40 days until she started drinking again 4 days ago.  Patient states that she is had some associated depression with drinking.  She states she would like help with the alcohol use.  Associated with our abuse was decreased oral intake.  Records reviewed. Per medical record review patient has a history of ER visits in the past for alcohol abuse.   Past Medical History:  Diagnosis Date  . Atypical squamous cell changes of undetermined significance (ASCUS) on cervical cytology with negative high risk human papilloma virus (HPV) test result 11/17/2014  . Bulimia nervosa   . Bunion 2001; 2006; 2007   BILATERAL  . Chlamydia 11/17/2014  . Depression   . Heart murmur    SEPTAL DEFECT  . 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 Dysplasia of cervix, 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/13/2020  . ETOH abuse 03/12/2020  . Premature rupture of membranes 01/25/2017  . High risk pregnancy, antepartum 11/29/2016  . Depression 04/07/2015  . MDD (major depressive disorder), single episode, severe , no psychosis (HCC) 04/06/2015  . Alcohol use disorder, severe, dependence (HCC) 04/06/2015  . Alcohol withdrawal (HCC) 04/06/2015  . Bulimia nervosa 04/06/2015    Past Surgical History:  Procedure Laterality Date  . CESAREAN SECTION N/A 01/25/2017   Procedure: CESAREAN SECTION;  Surgeon: Vena Austria, MD;  Location: [facility] ORS;   Service: Obstetrics;  Laterality: N/A;  . COLPOPEXY  08/08/2016   benign endocervical tissue with focally decidualized stroma compatible with progestin effect  . FOOT SURGERY  2001; 2006; 2007   left and right foot for bunion; two on the right and one on the left - between ages 83 & 22.    Prior to Admission medications   Medication Sig Start Date End Date Taking? Authorizing Provider  medroxyPROGESTERone Acetate 150 MG/ML SUSY INJECT 1 ML (150 MG TOTAL) INTO THE MUSCLE EVERY 3 (THREE) MONTHS. 08/15/20   Vena Austria, MD    Allergies Patient has no known allergies.  Family History  Problem Relation Age of Onset  . Diabetes Father   . Alcoholism Paternal Aunt   . Alcoholism Maternal Grandfather   . Cancer Paternal Grandfather   . Breast cancer Neg Hx     Social History Social History   Tobacco Use  . Smoking status: Never Smoker  . Smokeless tobacco: Never Used  Substance Use Topics  . Alcohol use: Yes    Comment: occ  . Drug use: No    Review of Systems Constitutional: No fever/chills. Positive for fatigue. Eyes: No visual changes. ENT: No sore throat. Cardiovascular: Denies chest pain. Respiratory: Denies shortness of breath. Gastrointestinal: No abdominal pain.  No nausea, no vomiting.  No diarrhea.   Genitourinary: Negative for dysuria. Musculoskeletal: Negative for back pain. Skin: Negative for rash. Neurological: Negative for headaches, focal weakness or numbness.  ____________________________________________   PHYSICAL EXAM:  VITAL SIGNS: ED Triage Vitals [  02/23/21 1442]  Enc Vitals Group     BP (!) 163/105     Pulse Rate 75     Resp 18     Temp 98.7 F (37.1 C)     Temp Source Oral     SpO2 100 %     Weight 125 lb (56.7 kg)     Height 5\' 4"  (1.626 m)     Head Circumference      Peak Flow      Pain Score 0    Constitutional: Alert and oriented.  Eyes: Conjunctivae are normal.  ENT      Head: Normocephalic and atraumatic.      Nose:  No congestion/rhinnorhea.      Mouth/Throat: Mucous membranes are moist.      Neck: No stridor. Hematological/Lymphatic/Immunilogical: No cervical lymphadenopathy. Cardiovascular: Normal rate, regular rhythm.  No murmurs, rubs, or gallops.  Respiratory: Normal respiratory effort without tachypnea nor retractions. Breath sounds are clear and equal bilaterally. No wheezes/rales/rhonchi. Gastrointestinal: Soft and non tender. No rebound. No guarding.  Genitourinary: Deferred Musculoskeletal: Normal range of motion in all extremities. No lower extremity edema. Neurologic:  Normal speech and language. No gross focal neurologic deficits are appreciated.  Skin:  Skin is warm, dry and intact. No rash noted. Psychiatric: Depressed tearful.  ____________________________________________    LABS (pertinent positives/negatives)  Ethanol 254 CMP wnl except alb 5.1 CBC wbc 7.7, hgb 14.8, plt 290 Upreg negative  ____________________________________________   EKG  None  ____________________________________________    RADIOLOGY  None  ____________________________________________   PROCEDURES  Procedures  ____________________________________________   INITIAL IMPRESSION / ASSESSMENT AND PLAN / ED COURSE  Pertinent labs & imaging results that were available during my care of the patient were reviewed by me and considered in my medical decision making (see chart for details).   Patient presented to the emergency department today because of concerns for alcohol abuse.  Patient has struggled with alcohol use issues.  Patient was evaluated by psychiatry.  They did give patient a Librium taper as she was not interested in inpatient rehab.  Patient did believe after getting prescription from psychiatry did not stay for discharge paperwork.  ____________________________________________   FINAL CLINICAL IMPRESSION(S) / ED DIAGNOSES  Final diagnoses:  Alcohol abuse     Note: This  dictation was prepared with Dragon dictation. Any transcriptional errors that result from this process are unintentional     , MD 02/23/21 [phone]

## 2021-02-23 NOTE — ED Notes (Signed)
Pt tearful about her drinking. States she did rehab a year ago but has been drinking since. Pt has 33 year old that she shares custody with ex husband-wants to be a good mom for child. Calm, cooperative, pleasant.

## 2021-02-23 NOTE — ED Notes (Signed)
Dr clapacs at bedside 

## 2021-02-23 NOTE — ED Triage Notes (Signed)
Pt states that she has been "drinking nonstop" since Tuesday and has not really been eating- pt states her last drink was this morning- pt is A&O x4

## 2021-02-23 NOTE — ED Notes (Signed)
Pt got rx from Dr Toni Amend and was noticed to not be on bed anymore. Not in lobby.

## 2021-05-20 ENCOUNTER — Other Ambulatory Visit: Payer: Self-pay

## 2021-05-20 ENCOUNTER — Emergency Department
Admission: EM | Admit: 2021-05-20 | Discharge: 2021-05-21 | Disposition: A | Discharge status: Other Acute Inpatient Hospital | Payer: No Typology Code available for payment source | Attending: Emergency Medicine | Admitting: Emergency Medicine

## 2021-05-20 DIAGNOSIS — R45851 Suicidal ideations: Secondary | ICD-10-CM | POA: Insufficient documentation

## 2021-05-20 DIAGNOSIS — Y908 Blood alcohol level of 240 mg/100 ml or more: Secondary | ICD-10-CM | POA: Insufficient documentation

## 2021-05-20 DIAGNOSIS — F32A Depression, unspecified: Secondary | ICD-10-CM | POA: Diagnosis not present

## 2021-05-20 DIAGNOSIS — F1014 Alcohol abuse with alcohol-induced mood disorder: Secondary | ICD-10-CM | POA: Insufficient documentation

## 2021-05-20 DIAGNOSIS — Z20822 Contact with and (suspected) exposure to covid-19: Secondary | ICD-10-CM | POA: Insufficient documentation

## 2021-05-20 DIAGNOSIS — F10120 Alcohol abuse with intoxication, uncomplicated: Secondary | ICD-10-CM | POA: Diagnosis present

## 2021-05-20 DIAGNOSIS — F1390 Sedative, hypnotic, or anxiolytic use, unspecified, uncomplicated: Secondary | ICD-10-CM | POA: Insufficient documentation

## 2021-05-20 DIAGNOSIS — F1020 Alcohol dependence, uncomplicated: Secondary | ICD-10-CM | POA: Diagnosis present

## 2021-05-20 DIAGNOSIS — F1094 Alcohol use, unspecified with alcohol-induced mood disorder: Secondary | ICD-10-CM | POA: Diagnosis present

## 2021-05-20 DIAGNOSIS — F1010 Alcohol abuse, uncomplicated: Secondary | ICD-10-CM

## 2021-05-20 LAB — COMPREHENSIVE METABOLIC PANEL
ALT: 19 U/L (ref 0–44)
AST: 23 U/L (ref 15–41)
Albumin: 4.9 g/dL (ref 3.5–5.0)
Alkaline Phosphatase: 92 U/L (ref 38–126)
Anion gap: 11 (ref 5–15)
BUN: 9 mg/dL (ref 6–20)
CO2: 25 mmol/L (ref 22–32)
Calcium: 9.1 mg/dL (ref 8.9–10.3)
Chloride: 101 mmol/L (ref 98–111)
Creatinine, Ser: 0.73 mg/dL (ref 0.44–1.00)
GFR, Estimated: >60 mL/min (ref >60)
Glucose, Bld: 92 mg/dL (ref 70–99)
Potassium: 4.3 mmol/L (ref 3.5–5.1)
Sodium: 137 mmol/L (ref 135–145)
Total Bilirubin: 1.1 mg/dL (ref 0.3–1.2)
Total Protein: 7.4 g/dL (ref 6.5–8.1)

## 2021-05-20 LAB — ACETAMINOPHEN LEVEL: Acetaminophen (Tylenol), Serum: <10 ug/mL — ABNORMAL LOW (ref 10–30)

## 2021-05-20 LAB — CBC WITH DIFFERENTIAL/PLATELET
Abs Immature Granulocytes: 0.01 10*3/uL (ref 0.00–0.07)
Basophils Absolute: 0.1 10*3/uL (ref 0.0–0.1)
Basophils Relative: 1 %
Eosinophils Absolute: 0.1 10*3/uL (ref 0.0–0.5)
Eosinophils Relative: 1 %
HCT: 39.6 % (ref 36.0–46.0)
Hemoglobin: 14.3 g/dL (ref 12.0–15.0)
Immature Granulocytes: 0 %
Lymphocytes Relative: 28 %
Lymphs Abs: 1.6 10*3/uL (ref 0.7–4.0)
MCH: 34 pg (ref 26.0–34.0)
MCHC: 36.1 g/dL — ABNORMAL HIGH (ref 30.0–36.0)
MCV: 94.3 fL (ref 80.0–100.0)
Monocytes Absolute: 0.4 10*3/uL (ref 0.1–1.0)
Monocytes Relative: 7 %
Neutro Abs: 3.6 10*3/uL (ref 1.7–7.7)
Neutrophils Relative %: 63 %
Platelets: 227 10*3/uL (ref 150–400)
RBC: 4.2 MIL/uL (ref 3.87–5.11)
RDW: 13.4 % (ref 11.5–15.5)
WBC: 5.6 10*3/uL (ref 4.0–10.5)
nRBC: 0 % (ref 0.0–0.2)

## 2021-05-20 LAB — URINE DRUG SCREEN, QUALITATIVE (ARMC ONLY)
Amphetamines, Ur Screen: NOT DETECTED
Barbiturates, Ur Screen: NOT DETECTED
Benzodiazepine, Ur Scrn: POSITIVE — AB
Cannabinoid 50 Ng, Ur ~~LOC~~: NOT DETECTED
Cocaine Metabolite,Ur ~~LOC~~: NOT DETECTED
MDMA (Ecstasy)Ur Screen: NOT DETECTED
Methadone Scn, Ur: NOT DETECTED
Opiate, Ur Screen: NOT DETECTED
Phencyclidine (PCP) Ur S: NOT DETECTED
Tricyclic, Ur Screen: NOT DETECTED

## 2021-05-20 LAB — SALICYLATE LEVEL: Salicylate Lvl: <7 mg/dL — ABNORMAL LOW (ref 7.0–30.0)

## 2021-05-20 LAB — RESP PANEL BY RT-PCR (FLU A&B, COVID) ARPGX2
Influenza A by PCR: NEGATIVE
Influenza B by PCR: NEGATIVE
SARS Coronavirus 2 by RT PCR: NEGATIVE

## 2021-05-20 LAB — ETHANOL: Alcohol, Ethyl (B): <10 mg/dL (ref <10)

## 2021-05-20 LAB — POC URINE PREG, ED: Preg Test, Ur: NEGATIVE

## 2021-05-20 MED ORDER — LORAZEPAM 2 MG/ML IJ SOLN: 0.0000 mg | Freq: Two times a day (BID) | INTRAMUSCULAR | Status: DC

## 2021-05-20 MED ORDER — LORAZEPAM 2 MG PO TABS: 0.0000 mg | ORAL_TABLET | Freq: Two times a day (BID) | ORAL | Status: DC

## 2021-05-20 MED ORDER — LORAZEPAM 2 MG PO TABS
0.0000 mg | ORAL_TABLET | Freq: Four times a day (QID) | ORAL | Status: DC
  Administered 2021-05-20: 1 mg via ORAL
  Filled 2021-05-20: qty 1

## 2021-05-20 MED ORDER — THIAMINE HCL 100 MG/ML IJ SOLN
100.0000 mg | Freq: Every day | INTRAMUSCULAR | Status: DC
  Filled 2021-05-20: qty 1

## 2021-05-20 MED ORDER — TRAZODONE HCL 50 MG PO TABS
50.0000 mg | ORAL_TABLET | Freq: Every day | ORAL | Status: DC
  Administered 2021-05-20: 50 mg via ORAL
  Filled 2021-05-20: qty 1

## 2021-05-20 MED ORDER — LORAZEPAM 1 MG PO TABS
1.0000 mg | ORAL_TABLET | Freq: Once | ORAL | Status: AC
  Administered 2021-05-20: 1 mg via ORAL
  Filled 2021-05-20: qty 1

## 2021-05-20 MED ORDER — SERTRALINE HCL 50 MG PO TABS
50.0000 mg | ORAL_TABLET | Freq: Every day | ORAL | Status: DC
  Administered 2021-05-20: 50 mg via ORAL
  Filled 2021-05-20: qty 1

## 2021-05-20 MED ORDER — THIAMINE HCL 100 MG PO TABS
100.0000 mg | ORAL_TABLET | Freq: Every day | ORAL | Status: DC
  Administered 2021-05-20: 100 mg via ORAL
  Filled 2021-05-20: qty 1

## 2021-05-20 MED ORDER — LORAZEPAM 2 MG/ML IJ SOLN: 0.0000 mg | Freq: Four times a day (QID) | INTRAMUSCULAR | Status: DC

## 2021-05-20 NOTE — ED Notes (Signed)
Psych team at bedside .

## 2021-05-20 NOTE — ED Notes (Addendum)
Pt dressed down. Belongings:  Black running shorts Pair of pink crocs [employer] blue devils T-shirt Neon yellow sports bra 5 earrings

## 2021-05-20 NOTE — BH Assessment (Signed)
Comprehensive Clinical Assessment (CCA) Note  05/20/2021 Monique Kaufman [id number]  Chief Complaint:  Chief Complaint  Patient presents with   Alcohol Intoxication   Visit Diagnosis: Alcohol Use Disorder   Monique Kaufman 35 year old female who presents to the ER seeking help with alcohol detox and treatment. She states she has been drinking two to three bottles of wine a day. Her symptoms of withdrawal are; cold chills, sweats, some shakes. She has no history of seizures or blackouts.  Due to her alcohol use, she sharing custody with her parents. She has several court dates for DWU's.  During the interview the patient was calm, cooperative, and pleasant. She was able to provide appropriate answers to the questions. She denies SI/HI and AV/H. Yesterday (05/19/2021), she had thoughts of ending her life, but had no intentions of following through with it. She reports of it was because of her alcohol use she was feeling depressed and worthless. Thus, she is seeking help for it.  CCA Screening, Triage and Referral (STR)  Patient Reported Information How did you hear about [place]? Self  What Is the Reason for Your Visit/Call Today? Seeking detox and treatment for her alcohol use.  How Long Has This Been Causing You Problems? > than 6 months  What Do You Feel Would Help You the Most Today? Alcohol or Drug Use Treatment   Have You Recently Had Any Thoughts About Hurting Yourself? Yes  Are You Planning to Commit Suicide/Harm Yourself At This time? No   Have you Recently Had Thoughts About Hurting Someone [name]? No  Are You Planning to Harm Someone at This Time? No  Explanation: No data recorded  Have You Used Any Alcohol or Drugs in the Past 24 Hours? Yes  How Long Ago Did You Use Drugs or Alcohol? No data recorded What Did You Use and How Much? Alcohol 05/20/2021   Do You Currently Have a Therapist/Psychiatrist? No  Name of Therapist/Psychiatrist: No data recorded  Have You Been  Recently Discharged From Any Office Practice or Programs? No  Explanation of Discharge From Practice/Program: No data recorded    CCA Screening Triage Referral Assessment Type of Contact: Face-to-Face  Telemedicine Service Delivery:   Is this Initial or Reassessment? No data recorded Date Telepsych consult ordered in [facility]:  No data recorded Time Telepsych consult ordered in [facility]:  No data recorded Location of Assessment: [facility]  Provider Location: [facility] ED   Collateral Involvement: No data recorded  Does Patient Have a Court Appointed Legal Guardian? No data recorded Name and Contact of Legal Guardian: No data recorded If Minor and Not Living with Parent(s), Who has Custody? No data recorded Is CPS involved or ever been involved? Never  Is APS involved or ever been involved? Never   Patient Determined To Be At Risk for Harm To Self or Others Based on Review of Patient Reported Information or Presenting Complaint? No  Method: No data recorded Availability of Means: No data recorded Intent: No data recorded Notification Required: No data recorded Additional Information for Danger to Others Potential: No data recorded Additional Comments for Danger to Others Potential: No data recorded Are There Guns or Other Weapons in Your Home? No data recorded Types of Guns/Weapons: No data recorded Are These Weapons Safely Secured?                            No data recorded Who Could Verify You Are Able To Have These  Secured: No data recorded Do You Have any Outstanding Charges, Pending Court Dates, Parole/Probation? No data recorded Contacted To Inform of Risk of Harm To Self or Others: No data recorded   Does Patient Present under Involuntary Commitment? No  IVC Papers Initial File Date: No data recorded  [place] of Residence: [place]   Patient Currently Receiving the Following Services: Not Receiving Services   Determination of Need: Emergent (2 hours)   Options For  Referral: Other: Comment     CCA Biopsychosocial Patient Reported Schizophrenia/Schizoaffective Diagnosis in Past: No   Strengths: Have some insight into her problems, seeking help voluntarily and have support system.   Mental Health Symptoms Depression:   Hopelessness; Difficulty Concentrating; Weight gain/loss; Worthlessness   Duration of Depressive symptoms:  Duration of Depressive Symptoms: Greater than two weeks   Mania:   N/A   Anxiety:    N/A   Psychosis:   None   Duration of Psychotic symptoms:    Trauma:   N/A   Obsessions:   N/A   Compulsions:   N/A   Inattention:   N/A   Hyperactivity/Impulsivity:   N/A   Oppositional/Defiant Behaviors:   N/A   Emotional Irregularity:   N/A   Other Mood/Personality Symptoms:  No data recorded   Mental Status Exam Appearance and self-care  Stature:   Average   Weight:   Average weight   Clothing:   Neat/clean; Age-appropriate   Grooming:   Neglected   Cosmetic use:   None   Posture/gait:   Normal   Motor activity:   -- (Within normal range)   Sensorium  Attention:   Normal   Concentration:   Normal   Orientation:   X5   Recall/memory:   Normal   Affect and Mood  Affect:   Appropriate; Depressed   Mood:   Depressed   Relating  Eye contact:   Normal   Facial expression:   Sad   Attitude toward examiner:   Cooperative   Thought and Language  Speech flow:  Normal   Thought content:   Appropriate to Mood and Circumstances   Preoccupation:   None   Hallucinations:   None   Organization:  No data recorded  [employer] of Knowledge:   Fair   Intelligence:   Average   Abstraction:   Normal   Judgement:   Fair   Dance movement psychotherapist:   Adequate   Insight:   Fair   Decision Making:   Impulsive   Social Functioning  Social Maturity:   Isolates; Irresponsible   Social Judgement:   "Chief of Staff"; Heedless   Stress  Stressors:    Legal; Relationship; Transitions   Coping Ability:   Exhausted   Skill Deficits:   Self-care   Supports:   Family     Religion: Religion/Spirituality Are You A Religious Person?: No  Leisure/Recreation: Leisure / Recreation Do You Have Hobbies?: No  Exercise/Diet: Exercise/Diet Do You Exercise?: No Have You Gained or Lost A Significant Amount of Weight in the Past Six Months?: No Do You Follow a Special Diet?: No Do You Have Any Trouble Sleeping?: No   CCA Employment/Education Employment/Work Situation: Employment / Work Situation Employment Situation: Unemployed Patient's Job has Been Impacted by Current Illness: Yes Describe how Patient's Job has Been Impacted: When she relapse she losses her job. Has Patient ever Been in the [employer]?: No  Education: Education Is Patient Currently Attending School?: No Did You Have An Individualized Education Program (IIEP):  No Did You Have Any Difficulty At School?: No Patient's Education Has Been Impacted by Current Illness: No   CCA Family/Childhood History Family and Relationship History: Family history Marital status: Divorced Divorced, when?: 2016 Does patient have children?: Yes How many children?: 1 How is patient's relationship with their children?: Shares custody with her parents.  Childhood History:  Childhood History By whom was/is the patient raised?: Both parents Did patient suffer any verbal/emotional/physical/sexual abuse as a child?: No Did patient suffer from severe childhood neglect?: No Has patient ever been sexually abused/assaulted/raped as an adolescent or adult?: No Was the patient ever a victim of a crime or a disaster?: No Witnessed domestic violence?: No Has patient been affected by domestic violence as an adult?: No  Child/Adolescent Assessment:     CCA Substance Use Alcohol/Drug Use: Alcohol / Drug Use Pain Medications: See PTA Prescriptions: See PTA Over the Counter: See  PTA History of alcohol / drug use?: Yes Longest period of sobriety (when/how long): Unable to quantify Negative Consequences of Use: Legal, Personal relationships, Work / Programmer, multimedia, Surveyor, quantity Withdrawal Symptoms: Fever / Chills, Nausea / Vomiting, Sweats Substance #1 Name of Substance 1: Alcohol 1 - Amount (size/oz): two to three bottles 1 - Frequency: Binges 1 - Duration: Problem drinking since 2016, after the divorce. 1 - Last Use / Amount: 05/20/2021 1 - Method of Aquiring: Purchases it 1- Route of Use: Oral   ASAM's:  Six Dimensions of Multidimensional Assessment  Dimension 1:  Acute Intoxication and/or Withdrawal Potential:      Dimension 2:  Biomedical Conditions and Complications:      Dimension 3:  Emotional, Behavioral, or Cognitive Conditions and Complications:     Dimension 4:  Readiness to Change:     Dimension 5:  Relapse, Continued use, or Continued Problem Potential:     Dimension 6:  Recovery/Living Environment:     ASAM Severity Score:    ASAM Recommended Level of Treatment:     Substance use Disorder (SUD) Substance Use Disorder (SUD)  Checklist Symptoms of Substance Use: Continued use despite having a persistent/recurrent physical/psychological problem caused/exacerbated by use, Evidence of withdrawal (Comment), Continued use despite persistent or recurrent social, interpersonal problems, caused or exacerbated by use, Recurrent use that results in a failure to fulfill major role obligations (work, school, home)  Recommendations for Services/Supports/Treatments: Recommendations for Services/Supports/Treatments Recommendations For Services/Supports/Treatments: Other (Comment)  Discharge Disposition:    DSM5 Diagnoses: Patient Active Problem List   Diagnosis Date Noted   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   Dysplasia of cervix, 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/13/2020   ETOH abuse 03/12/2020   Alcohol use disorder, severe, dependence (HCC) 04/06/2015    Alcohol withdrawal (HCC) 04/06/2015   Bulimia nervosa 04/06/2015     Referrals to Alternative Service(s): Referred to Alternative Service(s):   Place:   Date:   Time:    Referred to Alternative Service(s):   Place:   Date:   Time:    Referred to Alternative Service(s):   Place:   Date:   Time:    Referred to Alternative Service(s):   Place:   Date:   Time:     Lilyan Gilford MS, LCAS, [facility], [facility], [facility], [facility] Therapeutic Triage Specialist 05/20/2021 4:52 PM

## 2021-05-20 NOTE — ED Notes (Signed)
Hourly rounding reveals patient in room. No complaints, stable, in no acute distress. Q15 minute rounds and monitoring via Security Cameras to continue. 

## 2021-05-20 NOTE — Consult Note (Signed)
[facility] Face-to-Face Psychiatry Consult   Reason for Consult:  Alcohol detox request Referring Physician:  EDP Patient Identification: Monique SladeMeagan E Kaufman MRN:  [id number] Principal Diagnosis: Alcohol-induced mood disorder (HCC) Diagnosis:  Principal Problem:   Alcohol-induced mood disorder (HCC) Active Problems:   Alcohol use disorder, severe, dependence (HCC)   Total Time spent with patient: 1 hour  Subjective:   Monique Kaufman is a 35 y.o. female patient admitted with alcohol detox.  "I want to stop".  HPI:  35 yo female presents for alcohol detox and rehab.  She denies suicidal/homicidal ideations, hallucinations, or paranoia.  Last detox was 2 weeks ago at [facility] which she felt was not enough for her.  She quickly relapsed and is drinking 2-3 bottles of wine daily, more if she awakens.  Typically presents with BALs in the 200s.  Today, she is negative for alcohol.  Her last drink was last night, experiencing some "shakiness", denies history of withdrawal seizures.  No other substance use.  Stacie reports drinking "too numb the pain".  She is frustrated that she is trying to parent her 724 yo son while her parents are having to parent her because of her alcohol use.  This is effecting her family relationships and ability to work.  [facility] is being explored for admission.  Past Psychiatric History: alcohol use disorder, depression, anxiety  Risk to Self:  none Risk to Others:  none Prior Inpatient Therapy:  several detoxs and inpatient hospitalizations Prior Outpatient Therapy:  [facility]  Past Medical History:  Past Medical History:  Diagnosis Date   Atypical squamous cell changes of undetermined significance (ASCUS) on cervical cytology with negative high risk human papilloma virus (HPV) test result 11/17/2014   Bulimia nervosa    Bunion 2001; 2006; 2007   BILATERAL   Chlamydia 11/17/2014   Depression    Heart murmur    SEPTAL DEFECT   LGSIL on Pap smear of cervix 07/04/2016   HPV pos     Past Surgical History:  Procedure Laterality Date   CESAREAN SECTION N/A 01/25/2017   Procedure: CESAREAN SECTION;  Surgeon: Vena AustriaStaebler, Andreas, MD;  Location: [facility] ORS;  Service: Obstetrics;  Laterality: N/A;   COLPOPEXY  08/08/2016   benign endocervical tissue with focally decidualized stroma compatible with progestin effect   FOOT SURGERY  2001; 2006; 2007   left and right foot for bunion; two on the right and one on the left - between ages [age] & [age].   Family History:  Family History  Problem Relation Age of Onset   Diabetes Father    Alcoholism Paternal Aunt    Alcoholism Maternal Grandfather    Cancer Paternal Grandfather    Breast cancer Neg Hx    Family Psychiatric  History: see above Social History:  Social History   Substance and Sexual Activity  Alcohol Use Yes   Comment: occ     Social History   Substance and Sexual Activity  Drug Use No    Social History   Socioeconomic History   Marital status: Divorced    Spouse name: Not on file   Number of children: Not on file   Years of education: Not on file   Highest education level: Not on file  Occupational History   Not on file  Tobacco Use   Smoking status: Never   Smokeless tobacco: Never  Substance and Sexual Activity   Alcohol use: Yes    Comment: occ   Drug use: No   Sexual activity:  Yes    Birth control/protection: None, Injection  Other Topics Concern   Not on file  Social History Narrative   Not on file   Social Determinants of Health   Financial Resource Strain: Not on file  Food Insecurity: Not on file  Transportation Needs: Not on file  Physical Activity: Not on file  Stress: Not on file  Social Connections: Not on file   Additional Social History:    Allergies:  No Known Allergies  Labs:  Results for orders placed or performed during the hospital encounter of 05/20/21 (from the past 48 hour(s))  CBC with Differential     Status: Abnormal   Collection Time: 05/20/21  2:21 PM   Result Value Ref Range   WBC 5.6 4.0 - 10.5 K/uL   RBC 4.20 3.87 - 5.11 MIL/uL   Hemoglobin 14.3 12.0 - 15.0 g/dL   HCT 16.1 09.6 - 04.5 %   MCV 94.3 80.0 - 100.0 fL   MCH 34.0 26.0 - 34.0 pg   MCHC 36.1 (H) 30.0 - 36.0 g/dL   RDW 40.9 81.1 - 91.4 %   Platelets 227 150 - 400 K/uL   nRBC 0.0 0.0 - 0.2 %   Neutrophils Relative % 63 %   Neutro Abs 3.6 1.7 - 7.7 K/uL   Lymphocytes Relative 28 %   Lymphs Abs 1.6 0.7 - 4.0 K/uL   Monocytes Relative 7 %   Monocytes Absolute 0.4 0.1 - 1.0 K/uL   Eosinophils Relative 1 %   Eosinophils Absolute 0.1 0.0 - 0.5 K/uL   Basophils Relative 1 %   Basophils Absolute 0.1 0.0 - 0.1 K/uL   Immature Granulocytes 0 %   Abs Immature Granulocytes 0.01 0.00 - 0.07 K/uL    Comment: Performed at [facility], [address] [place], [place] [address]  Comprehensive metabolic panel     Status: None   Collection Time: 05/20/21  2:21 PM  Result Value Ref Range   Sodium 137 135 - 145 mmol/L   Potassium 4.3 3.5 - 5.1 mmol/L   Chloride 101 98 - 111 mmol/L   CO2 25 22 - 32 mmol/L   Glucose, Bld 92 70 - 99 mg/dL    Comment: Glucose reference range applies only to samples taken after fasting for at least 8 hours.   BUN 9 6 - 20 mg/dL   Creatinine, Ser 6.21 0.44 - 1.00 mg/dL   Calcium 9.1 8.9 - 30.8 mg/dL   Total Protein 7.4 6.5 - 8.1 g/dL   Albumin 4.9 3.5 - 5.0 g/dL   AST 23 15 - 41 U/L   ALT 19 0 - 44 U/L   Alkaline Phosphatase 92 38 - 126 U/L   Total Bilirubin 1.1 0.3 - 1.2 mg/dL   GFR, Estimated >65 >78 mL/min    Comment: (NOTE) Calculated using the CKD-EPI Creatinine Equation (2021)    Anion gap 11 5 - 15    Comment: Performed at [facility], [address] [place], [place] [address]  Salicylate level     Status: Abnormal   Collection Time: 05/20/21  2:21 PM  Result Value Ref Range   Salicylate Lvl <7.0 (L) 7.0 - 30.0 mg/dL    Comment: Performed at [facility], [address] [place], [place] [address]   Acetaminophen level     Status: Abnormal   Collection Time: 05/20/21  2:21 PM  Result Value Ref Range   Acetaminophen (Tylenol), Serum <10 (L) 10 - 30 ug/mL  Comment: (NOTE) Therapeutic concentrations vary significantly. A range of 10-30 ug/mL  may be an effective concentration for many patients. However, some  are best treated at concentrations outside of this range. Acetaminophen concentrations >150 ug/mL at 4 hours after ingestion  and >50 ug/mL at 12 hours after ingestion are often associated with  toxic reactions.  Performed at [facility], [address] [place], [place] [address]   Ethanol     Status: None   Collection Time: 05/20/21  2:21 PM  Result Value Ref Range   Alcohol, Ethyl (B) <10 <10 mg/dL    Comment: (NOTE) Lowest detectable limit for serum alcohol is 10 mg/dL.  For medical purposes only. Performed at [facility], [address] [place], [place] [address]   Resp Panel by RT-PCR (Flu A&B, Covid) Nasopharyngeal Swab     Status: None   Collection Time: 05/20/21  2:21 PM   Specimen: Nasopharyngeal Swab; Nasopharyngeal(NP) swabs in vial transport medium  Result Value Ref Range   SARS Coronavirus 2 by RT PCR NEGATIVE NEGATIVE    Comment: (NOTE) SARS-CoV-2 target nucleic acids are NOT DETECTED.  The SARS-CoV-2 RNA is generally detectable in upper respiratory specimens during the acute phase of infection. The lowest concentration of SARS-CoV-2 viral copies this assay can detect is 138 copies/mL. A negative result does not preclude SARS-Cov-2 infection and should not be used as the sole basis for treatment or other patient management decisions. A negative result may occur with  improper specimen collection/handling, submission of specimen other than nasopharyngeal swab, presence of viral mutation(s) within the areas targeted by this assay, and inadequate number of viral copies(<138 copies/mL). A negative result must be combined  with clinical observations, patient history, and epidemiological information. The expected result is Negative.  Fact Sheet for Patients:  BloggerCourse.com  Fact Sheet for Healthcare Providers:  SeriousBroker.it  This test is no t yet approved or cleared by the [place] FDA and  has been authorized for detection and/or diagnosis of SARS-CoV-2 by FDA under an Emergency Use Authorization (EUA). This EUA will remain  in effect (meaning this test can be used) for the duration of the COVID-19 declaration under Section 564(b)(1) of the Act, 21 U.S.C.section 360bbb-3(b)(1), unless the authorization is terminated  or revoked sooner.       Influenza A by PCR NEGATIVE NEGATIVE   Influenza B by PCR NEGATIVE NEGATIVE    Comment: (NOTE) The Xpert Xpress SARS-CoV-2/FLU/RSV plus assay is intended as an aid in the diagnosis of influenza from Nasopharyngeal swab specimens and should not be used as a sole basis for treatment. Nasal washings and aspirates are unacceptable for Xpert Xpress SARS-CoV-2/FLU/RSV testing.  Fact Sheet for Patients: BloggerCourse.com  Fact Sheet for Healthcare Providers: SeriousBroker.it  This test is not yet approved or cleared by the [place] FDA and has been authorized for detection and/or diagnosis of SARS-CoV-2 by FDA under an Emergency Use Authorization (EUA). This EUA will remain in effect (meaning this test can be used) for the duration of the COVID-19 declaration under Section 564(b)(1) of the Act, 21 U.S.C. section 360bbb-3(b)(1), unless the authorization is terminated or revoked.  Performed at [facility], [address] [place], [place] [address]     Current Facility-Administered Medications  Medication Dose Route Frequency Provider Last Rate Last Admin   LORazepam (ATIVAN) injection 0-4 mg  0-4 mg Intravenous Q6H Concha Se,  MD       Or   LORazepam (ATIVAN) tablet 0-4 mg  0-4 mg Oral Q6H Concha Se, MD   1 mg at 05/20/21 1617   [START ON 05/22/2021] LORazepam (ATIVAN) injection 0-4 mg  0-4 mg Intravenous Q12H Concha Se, MD       Or   Melene Muller ON 05/22/2021] LORazepam (ATIVAN) tablet 0-4 mg  0-4 mg Oral Q12H Concha Se, MD       thiamine tablet 100 mg  100 mg Oral Daily Concha Se, MD   100 mg at 05/20/21 1617   Or   thiamine (B-1) injection 100 mg  100 mg Intravenous Daily Concha Se, MD       Current Outpatient Medications  Medication Sig Dispense Refill   sertraline (ZOLOFT) 50 MG tablet Take 50 mg by mouth daily.     chlordiazePOXIDE (LIBRIUM) 25 MG capsule Take 1 capsule (25 mg total) by mouth 2 (two) times daily as needed for anxiety or withdrawal. Start tonight after going home. (Patient not taking: Reported on 05/20/2021) 4 capsule 0   medroxyPROGESTERone Acetate 150 MG/ML SUSY INJECT 1 ML (150 MG TOTAL) INTO THE MUSCLE EVERY 3 (THREE) MONTHS. 1 mL 3    Musculoskeletal: Strength & Muscle Tone: within normal limits Gait & Station: normal Patient leans: N/A  Psychiatric Specialty Exam: Physical Exam Vitals and nursing note reviewed.  Constitutional:      Appearance: Normal appearance.  HENT:     Head: Normocephalic.     Nose: Nose normal.  Pulmonary:     Effort: Pulmonary effort is normal.  Musculoskeletal:        General: Normal range of motion.     Cervical back: Normal range of motion.  Neurological:     General: No focal deficit present.     Mental Status: She is alert and oriented to person, place, and time.  Psychiatric:        Attention and Perception: Attention and perception normal.        Mood and Affect: Mood is anxious and depressed.        Speech: Speech normal.        Behavior: Behavior normal. Behavior is cooperative.        Thought Content: Thought content normal.        Cognition and Memory: Cognition and memory normal.        Judgment: Judgment normal.     Review of Systems  Psychiatric/Behavioral:  Positive for depression and substance abuse. The patient is nervous/anxious.   All other systems reviewed and are negative.  Blood pressure (!) 184/105, pulse 70, temperature 98.4 F (36.9 C), temperature source Oral, resp. rate 20, height 5\' 4"  (1.626 m), weight 54.4 kg, SpO2 98 %.Body mass index is 20.6 kg/m.  General Appearance: Casual  Eye Contact:  Good  Speech:  Normal Rate  Volume:  Normal  Mood:  Anxious and Depressed  Affect:  Congruent  Thought Process:  Coherent and Descriptions of Associations: Intact  Orientation:  Full (Time, Place, and Person)  Thought Content:  WDL and Logical  Suicidal Thoughts:  No  Homicidal Thoughts:  No  Memory:  Immediate;   Good Recent;   Good Remote;   Good  Judgement:  Fair  Insight:  Fair  Psychomotor Activity:  Normal  Concentration:  Concentration: Good and Attention Span: Good  Recall:  Good  Fund of Knowledge:  Good  Language:  Good  Akathisia:  No  Handed:  Right  AIMS (if indicated):     Assets:  Housing Leisure Time  Physical Health Resilience Social Support  ADL's:  Intact  Cognition:  WNL  Sleep:        Physical Exam: Physical Exam Vitals and nursing note reviewed.  Constitutional:      Appearance: Normal appearance.  HENT:     Head: Normocephalic.     Nose: Nose normal.  Pulmonary:     Effort: Pulmonary effort is normal.  Musculoskeletal:        General: Normal range of motion.     Cervical back: Normal range of motion.  Neurological:     General: No focal deficit present.     Mental Status: She is alert and oriented to person, place, and time.  Psychiatric:        Attention and Perception: Attention and perception normal.        Mood and Affect: Mood is anxious and depressed.        Speech: Speech normal.        Behavior: Behavior normal. Behavior is cooperative.        Thought Content: Thought content normal.        Cognition and Memory: Cognition and  memory normal.        Judgment: Judgment normal.   Review of Systems  Psychiatric/Behavioral:  Positive for depression and substance abuse. The patient is nervous/anxious.   All other systems reviewed and are negative. Blood pressure (!) 184/105, pulse 70, temperature 98.4 F (36.9 C), temperature source Oral, resp. rate 20, height 5\' 4"  (1.626 m), weight 54.4 kg, SpO2 98 %. Body mass index is 20.6 kg/m.  Treatment Plan Summary: Alcohol use disorder: -Started Ativan alcohol detox protocol -Referral to [facility]  Alcohol induced mood disorder: -Restarted Zoloft 50 mg daily  Disposition: No evidence of imminent risk to self or others at present.   Patient does not meet criteria for psychiatric inpatient admission. Supportive therapy provided about ongoing stressors.  , NP 05/20/2021 4:43 PM

## 2021-05-20 NOTE — ED Provider Notes (Signed)
[facility] Emergency Department Provider Note  ____________________________________________   Event Date/Time   First MD Initiated Contact with Patient 05/20/21 1414     (approximate)  I have reviewed the triage vital signs and the nursing notes.   HISTORY  Chief Complaint Alcohol Intoxication    HPI Monique Kaufman is a 35 y.o. female with history of alcohol use, depression who comes in with concern for alcohol withdrawal.  Patient reports last drink was last night at midnight.  Patient is reportedly wanting rehab.  Patient does report some depression and having some SI with some fleeting thoughts of maybe trying to cut herself yesterday but denies any of these thoughts currently.  She states that she feels worthless and she states that she feels like she is a bad mom.  Her parents currently have joint custody of her child and they are watching him today.  She reports that she does not drink daily but she goes on binges and that her alcohol drinking is severe, constant the last few days, nothing makes it better or worse.  She states that she is here voluntarily to try to get help.  Denies any other medical concerns.  Denies a history of complicated withdrawals weaned to hallucinations or seizures          Past Medical History:  Diagnosis Date   Atypical squamous cell changes of undetermined significance (ASCUS) on cervical cytology with negative high risk human papilloma virus (HPV) test result 11/17/2014   Bulimia nervosa    Bunion 2001; 2006; 2007   BILATERAL   Chlamydia 11/17/2014   Depression    Heart murmur    SEPTAL DEFECT   LGSIL on Pap smear of cervix 07/04/2016   HPV pos    Patient Active Problem List   Diagnosis Date Noted   Dysplasia of cervix, 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/13/2020   ETOH abuse 03/12/2020   Premature rupture of membranes 01/25/2017   High risk pregnancy, antepartum 11/29/2016   Depression 04/07/2015   MDD (major depressive  disorder), single episode, severe , no psychosis (HCC) 04/06/2015   Alcohol use disorder, severe, dependence (HCC) 04/06/2015   Alcohol withdrawal (HCC) 04/06/2015   Bulimia nervosa 04/06/2015    Past Surgical History:  Procedure Laterality Date   CESAREAN SECTION N/A 01/25/2017   Procedure: CESAREAN SECTION;  Surgeon: Vena Austria, MD;  Location: [facility] ORS;  Service: Obstetrics;  Laterality: N/A;   COLPOPEXY  08/08/2016   benign endocervical tissue with focally decidualized stroma compatible with progestin effect   FOOT SURGERY  2001; 2006; 2007   left and right foot for bunion; two on the right and one on the left - between ages 82 & 75.    Prior to Admission medications   Medication Sig Start Date End Date Taking? Authorizing Provider  chlordiazePOXIDE (LIBRIUM) 25 MG capsule Take 1 capsule (25 mg total) by mouth 2 (two) times daily as needed for anxiety or withdrawal. Start tonight after going home. 02/23/21   Clapacs, Jackquline Denmark, MD  medroxyPROGESTERone Acetate 150 MG/ML SUSY INJECT 1 ML (150 MG TOTAL) INTO THE MUSCLE EVERY 3 (THREE) MONTHS. 08/15/20   Vena Austria, MD    Allergies Patient has no known allergies.  Family History  Problem Relation Age of Onset   Diabetes Father    Alcoholism Paternal Aunt    Alcoholism Maternal Grandfather    Cancer Paternal Grandfather    Breast cancer Neg Hx     Social History Social History  Tobacco Use   Smoking status: Never   Smokeless tobacco: Never  Substance Use Topics   Alcohol use: Yes    Comment: occ   Drug use: No      Review of Systems Constitutional: No fever/chills Eyes: No visual changes. ENT: No sore throat. Cardiovascular: Denies chest pain. Respiratory: Denies shortness of breath. Gastrointestinal: No abdominal pain.  No nausea, no vomiting.  No diarrhea.  No constipation. Genitourinary: Negative for dysuria. Musculoskeletal: Negative for back pain. Skin: Negative for rash. Neurological: Negative for  headaches, focal weakness or numbness. Psych: depression.SI  All other ROS negative ____________________________________________   PHYSICAL EXAM:  VITAL SIGNS: Blood pressure (!) 184/105, pulse 70, temperature 98.4 F (36.9 C), temperature source Oral, resp. rate 20, height 5\' 4"  (1.626 m), weight 54.4 kg, SpO2 98 %.   Constitutional: Alert and oriented. Well appearing and in no acute distress. Eyes: Conjunctivae are normal. No swelling around eyes Head: Atraumatic. Nose: No congestion/rhinnorhea. Mouth/Throat: Mucous membranes are moist.   Neck: No stridor. Trachea Midline. FROM Cardiovascular: Normal rate, no swelling noted Respiratory: No increased wob, no stridor Gastrointestinal: Soft and nontender. No distention. No abdominal bruits.  Musculoskeletal: No lower extremity tenderness nor edema.  No joint effusions. Neurologic:  Normal speech and language. No gross focal neurologic deficits are appreciated.  Skin:  Skin is warm, dry and intact. No rash noted. Psychiatric: + depression  GU: Deferred   ____________________________________________   LABS (all labs ordered are listed, but only abnormal results are displayed)  Labs Reviewed  CBC WITH DIFFERENTIAL/PLATELET - Abnormal; Notable for the following components:      Result Value   MCHC 36.1 (*)    All other components within normal limits  SALICYLATE LEVEL - Abnormal; Notable for the following components:   Salicylate Lvl <7.0 (*)    All other components within normal limits  ACETAMINOPHEN LEVEL - Abnormal; Notable for the following components:   Acetaminophen (Tylenol), Serum <10 (*)    All other components within normal limits  RESP PANEL BY RT-PCR (FLU A&B, COVID) ARPGX2  COMPREHENSIVE METABOLIC PANEL  ETHANOL  URINE DRUG SCREEN, QUALITATIVE ([facility] ONLY)  POC URINE PREG, ED   ____________________________________________     PROCEDURES  Procedure(s) performed (including Critical  Care):  Procedures   ____________________________________________   INITIAL IMPRESSION / ASSESSMENT AND PLAN / ED COURSE  Monique Kaufman was evaluated in Emergency Department on 05/20/2021 for the symptoms described in the history of present illness. She was evaluated in the context of the global COVID-19 pandemic, which necessitated consideration that the patient might be at risk for infection with the SARS-CoV-2 virus that causes COVID-19. Institutional protocols and algorithms that pertain to the evaluation of patients at risk for COVID-19 are in a state of rapid change based on information released by regulatory bodies including the CDC and federal and state organizations. These policies and algorithms were followed during the patient's care in the ED.    Pt is without any acute medical complaints. No exam findings to suggest medical cause of current presentation. Will order psychiatric screening labs and discuss further w/ psychiatric service.  Patient has no desire to  leave and she is here voluntarily denies having a plan at this time so will leave voluntary     D/d includes but is not limited to psychiatric disease, behavioral/personality disorder, inadequate socioeconomic support, medical.  Based on HPI, exam, unremarkable labs, no concern for acute medical problem at this time. No rigidity, clonus, hyperthermia, focal neurologic  deficit, diaphoresis, tachycardia, meningismus, ataxia, gait abnormality or other finding to suggest this visit represents a non-psychiatric problem. Screening labs reviewed.    Given this, pt medically cleared, to be dispositioned per Psych.     The patient has been placed in psychiatric observation due to the need to provide a safe environment for the patient while obtaining psychiatric consultation and evaluation, as well as ongoing medical and medication management to treat the patient's condition.  The patient has not been placed under full IVC at this  time.        ____________________________________________   FINAL CLINICAL IMPRESSION(S) / ED DIAGNOSES   Final diagnoses:  ETOH abuse  Depression, unspecified depression type      MEDICATIONS GIVEN DURING THIS VISIT:  Medications  LORazepam (ATIVAN) tablet 1 mg (1 mg Oral Given 05/20/21 1439)     ED Discharge Orders     None        Note:  This document was prepared using Dragon voice recognition software and may include unintentional dictation errors.    Concha Se, MD 05/20/21 [phone]

## 2021-05-20 NOTE — BH Assessment (Signed)
Referral Checks;    Monique Kaufman [phone]), Monique Kaufman reports that facility is at capacity   [facility] [phone]) No answer   Monique Kaufman [phone]), No answer, voicemail was left for return phone call   [facility] [phone])    [facility] [phone] or [phone]) No answer, voicemail was left for return phone call   [facility] [phone]), Patient accepted refer to transfer note   [facility] [phone] -or- [phone]),    Monique Kaufman [phone])   Monique Kaufman [phone]).   [facility] [phone]), patient checked with them, and they don't take her Medicaid.   [facility] [phone]), Patient states she recently was with them approximately two weeks ago. It's too soon for her to be considered for admission.

## 2021-05-20 NOTE — ED Notes (Signed)
VOL moved to [facility] 8, placement to [facility] 8/28 after 0830

## 2021-05-20 NOTE — BH Assessment (Signed)
Referral information for Psychiatric Hospitalization faxed to;   [facility] [phone]),   [facility] [phone])  [facility] [phone]),  [facility] [phone])  [facility] [phone] or [phone])  [facility] [phone]),   [facility] [phone] -or- [phone]),   [facility] [phone])  [facility] [phone]).  [facility] [phone]), patient checked with them, and they don't take her Medicaid.  [facility] [phone]), Patient states she recently was with them approximately two weeks ago. It's too soon for her to be considered for admission.

## 2021-05-20 NOTE — ED Notes (Signed)
Report received from [place], English as a second language teacher. Patient alert and oriented, warm and dry, and in no acute distress. Patient denies SI now, HI, AVH and pain. Patient made aware of Q15 minute rounds and Psychologist, counselling presence for their safety. Patient instructed to come to this nurse with needs or concerns.

## 2021-05-20 NOTE — ED Notes (Signed)
MD at bedside.  Pt states "I feel like my family would be better off without me. I am a failure to them. I lost my job." Pt denies a plan of intent but felt like she might could cut her wrists but didn't want to go through with it. She has been in multiple facilities without improvement. Pt advised she is just really depressed.  Pt is CAOx4 and upset and crying.

## 2021-05-20 NOTE — BH Assessment (Addendum)
PATIENT BED AVAILABLE AFTER 8:30AM FOR 05/21/21  Patient has been accepted to [facility].  Patient assigned to [facility] Accepting physician is Dr. Estill Cotta.  Call report to [phone].  Representative was [employer].   ER Staff is aware of it:  Melody ER Secretary  Dr. Lenard Lance, ER MD  Thayer Ohm Patient's Nurse   Patient has been notified and is receptive of transfer

## 2021-05-20 NOTE — ED Notes (Signed)
Pt. Transferred to [facility] from ED to room 8 after screening for contraband. Report to include Situation, Background, Assessment and Recommendations from Chris RN. Pt. Oriented to unit including Q15 minute rounds as well as the security cameras for their protection. Patient is alert and oriented, warm and dry in no acute distress. Patient denies SI, HI, and AVH. Pt. Encouraged to let me know if needs arise.   

## 2021-05-20 NOTE — ED Triage Notes (Signed)
Pt BIB EMS from home. ETOH withdrawals. Last drink was last night at midnight. Pt wants rehab. Pt CAOx4. Pt complaint of anxiety.  HR 56 96% on RA  192/90

## 2021-05-20 NOTE — ED Notes (Signed)
Pt denies SI now, states she did yesterday

## 2021-05-21 NOTE — ED Notes (Signed)
[facility]

## 2021-05-21 NOTE — ED Notes (Signed)
Hourly rounding reveals patient in room. No complaints, stable, in no acute distress. Q15 minute rounds and monitoring via Security Cameras to continue. 

## 2021-05-21 NOTE — ED Notes (Signed)
Safe transport here for patient. Patient discharged

## 2021-05-21 NOTE — ED Notes (Signed)
[facility] called report given to [place] receiving nurse.

## 2022-03-11 ENCOUNTER — Emergency Department
Admission: EM | Admit: 2022-03-11 | Discharge: 2022-03-12 | Disposition: A | Discharge status: Other Acute Inpatient Hospital | Payer: No Typology Code available for payment source | Attending: Emergency Medicine | Admitting: Emergency Medicine

## 2022-03-11 ENCOUNTER — Other Ambulatory Visit: Payer: Self-pay

## 2022-03-11 DIAGNOSIS — F1010 Alcohol abuse, uncomplicated: Secondary | ICD-10-CM

## 2022-03-11 DIAGNOSIS — Y907 Blood alcohol level of 200-239 mg/100 ml: Secondary | ICD-10-CM | POA: Insufficient documentation

## 2022-03-11 DIAGNOSIS — F1020 Alcohol dependence, uncomplicated: Secondary | ICD-10-CM | POA: Diagnosis not present

## 2022-03-11 DIAGNOSIS — F1090 Alcohol use, unspecified, uncomplicated: Secondary | ICD-10-CM | POA: Diagnosis present

## 2022-03-11 DIAGNOSIS — F10920 Alcohol use, unspecified with intoxication, uncomplicated: Secondary | ICD-10-CM

## 2022-03-11 DIAGNOSIS — R45851 Suicidal ideations: Secondary | ICD-10-CM | POA: Insufficient documentation

## 2022-03-11 DIAGNOSIS — Z20822 Contact with and (suspected) exposure to covid-19: Secondary | ICD-10-CM | POA: Insufficient documentation

## 2022-03-11 DIAGNOSIS — F332 Major depressive disorder, recurrent severe without psychotic features: Secondary | ICD-10-CM | POA: Diagnosis present

## 2022-03-11 LAB — HEPATIC FUNCTION PANEL
ALT: 36 U/L (ref 0–44)
AST: 33 U/L (ref 15–41)
Albumin: 4.6 g/dL (ref 3.5–5.0)
Alkaline Phosphatase: 88 U/L (ref 38–126)
Bilirubin, Direct: 0.2 mg/dL (ref 0.0–0.2)
Indirect Bilirubin: 1 mg/dL — ABNORMAL HIGH (ref 0.3–0.9)
Total Bilirubin: 1.2 mg/dL (ref 0.3–1.2)
Total Protein: 7.8 g/dL (ref 6.5–8.1)

## 2022-03-11 LAB — CBC WITH DIFFERENTIAL/PLATELET
Abs Immature Granulocytes: 0.01 10*3/uL (ref 0.00–0.07)
Basophils Absolute: 0.1 10*3/uL (ref 0.0–0.1)
Basophils Relative: 2 %
Eosinophils Absolute: 0.1 10*3/uL (ref 0.0–0.5)
Eosinophils Relative: 2 %
HCT: 44.4 % (ref 36.0–46.0)
Hemoglobin: 15.3 g/dL — ABNORMAL HIGH (ref 12.0–15.0)
Immature Granulocytes: 0 %
Lymphocytes Relative: 46 %
Lymphs Abs: 2.8 10*3/uL (ref 0.7–4.0)
MCH: 31.5 pg (ref 26.0–34.0)
MCHC: 34.5 g/dL (ref 30.0–36.0)
MCV: 91.5 fL (ref 80.0–100.0)
Monocytes Absolute: 0.5 10*3/uL (ref 0.1–1.0)
Monocytes Relative: 8 %
Neutro Abs: 2.6 10*3/uL (ref 1.7–7.7)
Neutrophils Relative %: 42 %
Platelets: 355 10*3/uL (ref 150–400)
RBC: 4.85 MIL/uL (ref 3.87–5.11)
RDW: 11.9 % (ref 11.5–15.5)
WBC: 6.2 10*3/uL (ref 4.0–10.5)
nRBC: 0 % (ref 0.0–0.2)

## 2022-03-11 LAB — RESP PANEL BY RT-PCR (FLU A&B, COVID) ARPGX2
Influenza A by PCR: NEGATIVE
Influenza B by PCR: NEGATIVE
SARS Coronavirus 2 by RT PCR: NEGATIVE

## 2022-03-11 LAB — URINE DRUG SCREEN, QUALITATIVE (ARMC ONLY)
Amphetamines, Ur Screen: NOT DETECTED
Barbiturates, Ur Screen: NOT DETECTED
Benzodiazepine, Ur Scrn: NOT DETECTED
Cannabinoid 50 Ng, Ur ~~LOC~~: NOT DETECTED
Cocaine Metabolite,Ur ~~LOC~~: NOT DETECTED
MDMA (Ecstasy)Ur Screen: NOT DETECTED
Methadone Scn, Ur: NOT DETECTED
Opiate, Ur Screen: NOT DETECTED
Phencyclidine (PCP) Ur S: NOT DETECTED
Tricyclic, Ur Screen: NOT DETECTED

## 2022-03-11 LAB — BASIC METABOLIC PANEL
Anion gap: 13 (ref 5–15)
BUN: 12 mg/dL (ref 6–20)
CO2: 24 mmol/L (ref 22–32)
Calcium: 9 mg/dL (ref 8.9–10.3)
Chloride: 102 mmol/L (ref 98–111)
Creatinine, Ser: 0.78 mg/dL (ref 0.44–1.00)
GFR, Estimated: >60 mL/min (ref >60)
Glucose, Bld: 106 mg/dL — ABNORMAL HIGH (ref 70–99)
Potassium: 3.9 mmol/L (ref 3.5–5.1)
Sodium: 139 mmol/L (ref 135–145)

## 2022-03-11 LAB — URINALYSIS, ROUTINE W REFLEX MICROSCOPIC
Bilirubin Urine: NEGATIVE
Glucose, UA: NEGATIVE mg/dL
Hgb urine dipstick: NEGATIVE
Ketones, ur: NEGATIVE mg/dL
Leukocytes,Ua: NEGATIVE
Nitrite: NEGATIVE
Protein, ur: 100 mg/dL — AB
Specific Gravity, Urine: 1.018 (ref 1.005–1.030)
pH: 6 (ref 5.0–8.0)

## 2022-03-11 LAB — ETHANOL: Alcohol, Ethyl (B): 233 mg/dL — ABNORMAL HIGH (ref <10)

## 2022-03-11 LAB — SALICYLATE LEVEL: Salicylate Lvl: <7 mg/dL — ABNORMAL LOW (ref 7.0–30.0)

## 2022-03-11 LAB — POC URINE PREG, ED: Preg Test, Ur: NEGATIVE

## 2022-03-11 LAB — LIPASE, BLOOD: Lipase: 34 U/L (ref 11–51)

## 2022-03-11 LAB — ACETAMINOPHEN LEVEL: Acetaminophen (Tylenol), Serum: <10 ug/mL — ABNORMAL LOW (ref 10–30)

## 2022-03-11 MED ORDER — NALTREXONE HCL 50 MG PO TABS
25.0000 mg | ORAL_TABLET | Freq: Once | ORAL | Status: AC
  Administered 2022-03-11: 25 mg via ORAL
  Filled 2022-03-11: qty 1

## 2022-03-11 MED ORDER — THIAMINE HCL 100 MG/ML IJ SOLN: 100.0000 mg | Freq: Every day | INTRAMUSCULAR | Status: DC

## 2022-03-11 MED ORDER — FOLIC ACID 1 MG PO TABS
1.0000 mg | ORAL_TABLET | Freq: Every day | ORAL | Status: DC
Start: 2022-03-11 — End: 2022-03-12
  Administered 2022-03-11: 1 mg via ORAL
  Filled 2022-03-11: qty 1

## 2022-03-11 MED ORDER — FOLIC ACID 5 MG/ML IJ SOLN
1.0000 mg | Freq: Every day | INTRAMUSCULAR | Status: DC
  Administered 2022-03-11: 1 mg via INTRAVENOUS
  Filled 2022-03-11 (×2): qty 0.2

## 2022-03-11 MED ORDER — NALTREXONE HCL 50 MG PO TABS
50.0000 mg | ORAL_TABLET | Freq: Every day | ORAL | Status: DC
  Filled 2022-03-11: qty 1

## 2022-03-11 MED ORDER — LORAZEPAM 2 MG/ML IJ SOLN: 1.0000 mg | INTRAMUSCULAR | Status: DC | PRN

## 2022-03-11 MED ORDER — SERTRALINE HCL 50 MG PO TABS
25.0000 mg | ORAL_TABLET | Freq: Every day | ORAL | Status: DC
  Administered 2022-03-11: 25 mg via ORAL
  Filled 2022-03-11: qty 1

## 2022-03-11 MED ORDER — DIPHENHYDRAMINE HCL 50 MG/ML IJ SOLN
25.0000 mg | Freq: Once | INTRAMUSCULAR | Status: AC
Start: 2022-03-11 — End: 2022-03-11
  Administered 2022-03-11: 25 mg via INTRAVENOUS
  Filled 2022-03-11: qty 1

## 2022-03-11 MED ORDER — SODIUM CHLORIDE 0.9 % IV BOLUS
1000.0000 mL | Freq: Once | INTRAVENOUS | Status: AC
  Administered 2022-03-11: 1000 mL via INTRAVENOUS

## 2022-03-11 MED ORDER — THIAMINE HCL 100 MG/ML IJ SOLN
100.0000 mg | Freq: Every day | INTRAMUSCULAR | Status: DC
  Administered 2022-03-11: 100 mg via INTRAVENOUS
  Filled 2022-03-11: qty 2

## 2022-03-11 MED ORDER — LORAZEPAM 1 MG PO TABS
1.0000 mg | ORAL_TABLET | ORAL | Status: DC | PRN
  Administered 2022-03-11: 2 mg via ORAL
  Administered 2022-03-11 – 2022-03-12 (×2): 1 mg via ORAL
  Filled 2022-03-11: qty 1
  Filled 2022-03-11: qty 2
  Filled 2022-03-11: qty 1

## 2022-03-11 MED ORDER — THIAMINE HCL 100 MG PO TABS: 100.0000 mg | ORAL_TABLET | Freq: Every day | ORAL | Status: DC

## 2022-03-11 MED ORDER — ADULT MULTIVITAMIN W/MINERALS CH
1.0000 | ORAL_TABLET | Freq: Every day | ORAL | Status: DC
  Administered 2022-03-11: 1 via ORAL
  Filled 2022-03-11: qty 1

## 2022-03-11 NOTE — Consult Note (Signed)
[facility] Face-to-Face Psychiatry Consult   Reason for Consult:  alcohol intoxication, depression  Referring Physician:  Dr. Donna Bernard (ED-P) Patient Identification: Monique Kaufman MRN:  [id number] Principal Diagnosis: Major depressive disorder, recurrent severe without psychotic features (HCC) Diagnosis:  Principal Problem:   Major depressive disorder, recurrent severe without psychotic features (HCC) Active Problems:   Alcohol use disorder, severe, dependence (HCC)   Total Time spent with patient: 45 minutes  Subjective:   Monique Kaufman is a 36 y.o. female patient admitted with alcohol intoxication requesting rehabilitation services .  HPI:  Patient presents to ED with mother for alcohol detox and increase in depressive and anxiety symptoms. Patient tearful when speaking to this provider. Patient reports she is a "binge drinker", drinking 2-3 bottles of wine per day. This current episode has been occurring for 5 days. Patient has been using alcohol for 6- 7 years. Pt reports she has been to multiple rehabs. Last rehab was 90 days at [facility]. "I can't keep feeling this way, I can't keep doing this". Patient endorses suicidal ideations, "I think everyone will be better off without me, I have caused too many problems". Patient denies homicidal ideations and A/V hallucinations at this time.   Per patient's mother, significant alcohol abuse history in family. Patient's maternal grandparents and maternal aunt. Mother reports patient has  period of sobriety and will attend AA meetings and church.   Per notes from Dr. Trixie Dredge, ED physician: Monique Kaufman is a 36 y.o. female with a stated past medical history of alcohol abuse who presents after drinking for 5 days straight with minimal other p.o. intake.  Patient states that she regularly drinks 2-3 bottles of wine per day.  Patient denies any history of complicated withdrawals including specifically denying any seizures or hallucinations.   Patient also states that she has been feeling severely depressed with suicidal ideation without a significant plan.  Patient would like to speak to a psychiatrist here today due to these symptoms.  Patient currently denies any homicidal ideation, auditory/visual hallucinations.  Patient currently denies any vision changes, tinnitus, difficulty speaking, facial droop, sore throat, chest pain, shortness of breath, abdominal pain, nausea/vomiting/diarrhea, dysuria, or weakness/numbness/paresthesias in any extremity  Past Psychiatric History: Major Depression Disorder, Alcohol use disorder, Bulimia nervosa, Childhood trauma  Risk to Self: Yes Risk to Others:  None Prior Inpatient Therapy:  Previous inpatient hospitalization in 2016, Multiple rehab programs (for alcohol) Prior Outpatient Therapy:  Group therapy (AA), Medications (Zoloft)   Past Medical History:  Past Medical History:  Diagnosis Date   Atypical squamous cell changes of undetermined significance (ASCUS) on cervical cytology with negative high risk human papilloma virus (HPV) test result 11/17/2014   Bulimia nervosa    Bunion 2001; 2006; 2007   BILATERAL   Chlamydia 11/17/2014   Depression    Heart murmur    SEPTAL DEFECT   LGSIL on Pap smear of cervix 07/04/2016   HPV pos    Past Surgical History:  Procedure Laterality Date   CESAREAN SECTION N/A 01/25/2017   Procedure: CESAREAN SECTION;  Surgeon: Vena Austria, MD;  Location: [facility] ORS;  Service: Obstetrics;  Laterality: N/A;   COLPOPEXY  08/08/2016   benign endocervical tissue with focally decidualized stroma compatible with progestin effect   FOOT SURGERY  2001; 2006; 2007   left and right foot for bunion; two on the right and one on the left - between ages 7 & 5.   Family History:  Family History  Problem  Relation Age of Onset   Diabetes Father    Alcoholism Paternal Aunt    Alcoholism Maternal Grandfather    Cancer Paternal Grandfather    Breast cancer Neg Hx     Family Psychiatric  History: Alcohol use  Social History:  Social History   Substance and Sexual Activity  Alcohol Use Yes   Comment: occ     Social History   Substance and Sexual Activity  Drug Use No    Social History   Socioeconomic History   Marital status: Divorced    Spouse name: Not on file   Number of children: Not on file   Years of education: Not on file   Highest education level: Not on file  Occupational History   Not on file  Tobacco Use   Smoking status: Never   Smokeless tobacco: Never  Substance and Sexual Activity   Alcohol use: Yes    Comment: occ   Drug use: No   Sexual activity: Yes    Birth control/protection: None, Injection  Other Topics Concern   Not on file  Social History Narrative   Not on file   Social Determinants of Health   Financial Resource Strain: Not on file  Food Insecurity: Not on file  Transportation Needs: Not on file  Physical Activity: Not on file  Stress: Not on file  Social Connections: Not on file   Additional Social History: Patient has 34 year old son, previous divorce.    Allergies:  No Known Allergies  Labs:  Results for orders placed or performed during the hospital encounter of 03/11/22 (from the past 48 hour(s))  CBC with Differential     Status: Abnormal   Collection Time: 03/11/22  2:45 PM  Result Value Ref Range   WBC 6.2 4.0 - 10.5 K/uL   RBC 4.85 3.87 - 5.11 MIL/uL   Hemoglobin 15.3 (H) 12.0 - 15.0 g/dL   HCT 44.4 36.0 - 46.0 %   MCV 91.5 80.0 - 100.0 fL   MCH 31.5 26.0 - 34.0 pg   MCHC 34.5 30.0 - 36.0 g/dL   RDW 11.9 11.5 - 15.5 %   Platelets 355 150 - 400 K/uL   nRBC 0.0 0.0 - 0.2 %   Neutrophils Relative % 42 %   Neutro Abs 2.6 1.7 - 7.7 K/uL   Lymphocytes Relative 46 %   Lymphs Abs 2.8 0.7 - 4.0 K/uL   Monocytes Relative 8 %   Monocytes Absolute 0.5 0.1 - 1.0 K/uL   Eosinophils Relative 2 %   Eosinophils Absolute 0.1 0.0 - 0.5 K/uL   Basophils Relative 2 %   Basophils Absolute  0.1 0.0 - 0.1 K/uL   Immature Granulocytes 0 %   Abs Immature Granulocytes 0.01 0.00 - 0.07 K/uL    Comment: Performed at [facility], [address] [place], [place] [address]  Hepatic function panel     Status: Abnormal   Collection Time: 03/11/22  2:45 PM  Result Value Ref Range   Total Protein 7.8 6.5 - 8.1 g/dL   Albumin 4.6 3.5 - 5.0 g/dL   AST 33 15 - 41 U/L   ALT 36 0 - 44 U/L   Alkaline Phosphatase 88 38 - 126 U/L   Total Bilirubin 1.2 0.3 - 1.2 mg/dL   Bilirubin, Direct 0.2 0.0 - 0.2 mg/dL   Indirect Bilirubin 1.0 (H) 0.3 - 0.9 mg/dL    Comment: Performed at [facility], [address] [place],  [place] [address]  Ethanol     Status: Abnormal   Collection Time: 03/11/22  2:45 PM  Result Value Ref Range   Alcohol, Ethyl (B) 233 (H) <10 mg/dL    Comment: (NOTE) Lowest detectable limit for serum alcohol is 10 mg/dL.  For medical purposes only. Performed at [facility], [address] [place], [place] [address]   Lipase, blood     Status: None   Collection Time: 03/11/22  2:45 PM  Result Value Ref Range   Lipase 34 11 - 51 U/L    Comment: Performed at [facility], [address] [place], [place] [address]  Salicylate level     Status: Abnormal   Collection Time: 03/11/22  2:45 PM  Result Value Ref Range   Salicylate Lvl <7.0 (L) 7.0 - 30.0 mg/dL    Comment: Performed at [facility], [address] [place], [place] [address]  Acetaminophen level     Status: Abnormal   Collection Time: 03/11/22  2:45 PM  Result Value Ref Range   Acetaminophen (Tylenol), Serum <10 (L) 10 - 30 ug/mL    Comment: (NOTE) Therapeutic concentrations vary significantly. A range of 10-30 ug/mL  may be an effective concentration for many patients. However, some  are best treated at concentrations outside of this range. Acetaminophen concentrations >150 ug/mL at 4 hours after ingestion  and >50 ug/mL at 12 hours after ingestion are  often associated with  toxic reactions.  Performed at [facility], [address] [place], [place] [address]   POC urine preg, ED     Status: None   Collection Time: 03/11/22  3:51 PM  Result Value Ref Range   Preg Test, Ur NEGATIVE NEGATIVE    Comment:        THE SENSITIVITY OF THIS METHODOLOGY IS >24 mIU/mL     Current Facility-Administered Medications  Medication Dose Route Frequency Provider Last Rate Last Admin   folic acid (FOLVITE) tablet 1 mg  1 mg Oral Daily Bradler, Clent Jacks, MD       folic acid injection 1 mg  1 mg Intravenous Daily Poggi, Jenna E, PA-C       LORazepam (ATIVAN) tablet 1-4 mg  1-4 mg Oral Q1H PRN Merwyn Katos, MD       Or   LORazepam (ATIVAN) injection 1-4 mg  1-4 mg Intravenous Q1H PRN Merwyn Katos, MD       multivitamin with minerals tablet 1 tablet  1 tablet Oral Daily Merwyn Katos, MD       thiamine (B-1) injection 100 mg  100 mg Intravenous Daily Poggi, Jenna E, PA-C   100 mg at 03/11/22 1503   thiamine tablet 100 mg  100 mg Oral Daily Bradler, Clent Jacks, MD       Or   thiamine (B-1) injection 100 mg  100 mg Intravenous Daily Merwyn Katos, MD       Current Outpatient Medications  Medication Sig Dispense Refill   sertraline (ZOLOFT) 50 MG tablet Take 50 mg by mouth daily.     chlordiazePOXIDE (LIBRIUM) 25 MG capsule Take 1 capsule (25 mg total) by mouth 2 (two) times daily as needed for anxiety or withdrawal. Start tonight after going home. (Patient not taking: Reported on 05/20/2021) 4 capsule 0   medroxyPROGESTERone Acetate 150 MG/ML SUSY INJECT 1 ML (150 MG TOTAL) INTO THE MUSCLE EVERY 3 (THREE) MONTHS. (Patient not taking: Reported on 03/11/2022) 1 mL 3  Musculoskeletal: Strength & Muscle Tone: within normal limits Gait & Station: normal Patient leans: N/A  Psychiatric Specialty Exam: Physical Exam Vitals and nursing note reviewed.  Constitutional:      Appearance: Normal appearance. She is normal weight.  HENT:      Head: Normocephalic.  Pulmonary:     Effort: Pulmonary effort is normal.  Musculoskeletal:        General: Normal range of motion.     Cervical back: Normal range of motion.  Neurological:     General: No focal deficit present.     Mental Status: She is alert and oriented to person, place, and time. Mental status is at baseline.  Psychiatric:        Attention and Perception: Attention and perception normal.        Mood and Affect: Mood is anxious and depressed. Affect is tearful.        Speech: Speech normal.        Behavior: Behavior normal. Behavior is cooperative.        Thought Content: Thought content includes suicidal ideation.        Cognition and Memory: Cognition and memory normal.        Judgment: Judgment normal.    Review of Systems  Blood pressure 121/75, pulse 72, temperature 98.1 F (36.7 C), temperature source Oral, resp. rate (!) 26, height 5\' 4"  (1.626 m), weight 56.7 kg, SpO2 100 %.Body mass index is 21.46 kg/m.  General Appearance: Fairly Groomed  Eye Contact:  Good  Speech:  Clear and Coherent and Normal Rate  Volume:  Decreased  Mood:  Anxious, Depressed, and Worthless  Affect:  Depressed and Tearful  Thought Process:  Coherent and Goal Directed  Orientation:  Full (Time, Place, and Person)  Thought Content:  Logical  Suicidal Thoughts:  Yes  Homicidal Thoughts:  No  Memory:  Immediate;   Fair Recent;   Fair Remote;   Fair  Judgement:  Fair  Insight:  Fair  Psychomotor Activity:  Normal  Concentration:  Concentration: Fair  Recall:  [place] of Knowledge:  Good  Language:  Good  Akathisia:  No  Handed:  Right  AIMS (if indicated):     Assets:  Communication Skills Desire for Improvement Housing Social Support  ADL's:  Intact  Cognition:  WNL  Sleep:  Fair      Physical Exam: Physical Exam Vitals and nursing note reviewed.  Constitutional:      Appearance: Normal appearance. She is normal weight.  HENT:     Head: Normocephalic.   Pulmonary:     Effort: Pulmonary effort is normal.  Musculoskeletal:        General: Normal range of motion.     Cervical back: Normal range of motion.  Neurological:     General: No focal deficit present.     Mental Status: She is alert and oriented to person, place, and time. Mental status is at baseline.  Psychiatric:        Attention and Perception: Attention and perception normal.        Mood and Affect: Mood is anxious and depressed. Affect is tearful.        Speech: Speech normal.        Behavior: Behavior normal. Behavior is cooperative.        Thought Content: Thought content includes suicidal ideation.        Cognition and Memory: Cognition and memory normal.        Judgment:  Judgment normal.   ROS Blood pressure 121/75, pulse 72, temperature 98.1 F (36.7 C), temperature source Oral, resp. rate (!) 26, height 5\' 4"  (1.626 m), weight 56.7 kg, SpO2 100 %. Body mass index is 21.46 kg/m.  Treatment Plan Summary: Daily contact with patient to assess and evaluate symptoms and progress in treatment and Medication management Major depressive disorder, recurrent, severe without psychosis: Restarted Zoloft at 25 mg daily as she has been off of it for 5 days  Alcohol use disorder: Continue Ativan detox protocol Started naltrexone 25 mg today and 50 mg tomorrow for cravings  Disposition: Recommend psychiatric Inpatient admission when medically cleared.  Waylan Boga, NP 03/11/2022 4:01 PM

## 2022-03-11 NOTE — ED Notes (Signed)
Pt c/o any bites on her legs and buttocks, skin red. Provider notified.

## 2022-03-11 NOTE — ED Notes (Addendum)
Patient dressed out at this time into blue paper scrubs Patient belonging as is  1 pair grey earring 1 pink bra 1 black shirt 1 pair pink shoes 1 pair pants 1 pair underwear 1 brown purse 1 cell phone

## 2022-03-11 NOTE — TOC Initial Note (Addendum)
Transition of Care [facility]) - Initial/Assessment Note    Patient Details  Name: Monique Kaufman MRN: [id number] Date of Birth: 06-13-1986  [facility] CM/SW Contact:    Merrily Brittle, LCSWA Phone Number: 03/11/2022, 4:01 PM  Clinical Narrative:                  [facility] consult for substance use concerns. CSW provided outpatient and residential substance use resources via AVS for patient to review when stable.   No further TOC needs at this time.        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 [place] [address] Phone: [phone] Fax: [phone]  [id number] [place], [place] - [address] [place] [address] Phone: [phone] Fax: [phone]     Social Determinants of Health (SDOH) Interventions    Readmission Risk Interventions     No data to display

## 2022-03-11 NOTE — ED Notes (Signed)
Pt given a sprite 

## 2022-03-11 NOTE — BH Assessment (Addendum)
Referral information for Detox and Sbustance Abuse Treatment faxed to;   [facility] [phone]),   [facility] [phone])  [facility] [phone]),  [facility] [phone]) (Fax#-[phone])  [facility] [phone] or [phone])  [facility] [phone]),   [facility] [phone]- [phone]),   [facility] [phone])  [facility] [phone]).  [facility] [phone])  [employer] [phone])
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
## 2022-03-11 NOTE — ED Provider Notes (Signed)
[facility] Provider Note   Event Date/Time   First MD Initiated Contact with Patient 03/11/22 1503     (approximate) History  Alcohol Problem  HPI Monique Kaufman is a 36 y.o. female with a stated past medical history of alcohol abuse who presents after drinking for 5 days straight with minimal other p.o. intake.  Patient states that she regularly drinks 2-3 bottles of wine per day.  Patient denies any history of complicated withdrawals including specifically denying any seizures or hallucinations.  Patient also states that she has been feeling severely depressed with suicidal ideation without a significant plan.  Patient would like to speak to a psychiatrist here today due to these symptoms.  Patient currently denies any homicidal ideation, auditory/visual hallucinations.  Patient currently denies any vision changes, tinnitus, difficulty speaking, facial droop, sore throat, chest pain, shortness of breath, abdominal pain, nausea/vomiting/diarrhea, dysuria, or weakness/numbness/paresthesias in any extremity   Physical Exam  Triage Vital Signs: ED Triage Vitals  Enc Vitals Group     BP 03/11/22 1441 (!) 189/126     Pulse Rate 03/11/22 1441 67     Resp 03/11/22 1441 20     Temp 03/11/22 1441 98.1 F (36.7 C)     Temp Source 03/11/22 1441 Oral     SpO2 03/11/22 1441 100 %     Weight 03/11/22 1444 125 lb (56.7 kg)     Height 03/11/22 1444 5\' 4"  (1.626 m)     Head Circumference --      Peak Flow --      Pain Score 03/11/22 1442 0     Pain Loc --      Pain Edu? --      Excl. in GC? --    Most recent vital signs: Vitals:   03/11/22 1441 03/11/22 1527  BP: (!) 189/126 121/75  Pulse: 67 72  Resp: 20 (!) 26  Temp: 98.1 F (36.7 C)   SpO2: 100% 100%   General: Awake, oriented x4. CV:  Good peripheral perfusion.  Resp:  Normal effort.  Abd:  No distention.  Other:  Middle-aged Caucasian female laying in bed in no acute distress.  Mildly slurred speech ED  Results / Procedures / Treatments  Labs (all labs ordered are listed, but only abnormal results are displayed) Labs Reviewed  CBC WITH DIFFERENTIAL/PLATELET - Abnormal; Notable for the following components:      Result Value   Hemoglobin 15.3 (*)    All other components within normal limits  HEPATIC FUNCTION PANEL - Abnormal; Notable for the following components:   Indirect Bilirubin 1.0 (*)    All other components within normal limits  ETHANOL - Abnormal; Notable for the following components:   Alcohol, Ethyl (B) 233 (*)    All other components within normal limits  SALICYLATE LEVEL - Abnormal; Notable for the following components:   Salicylate Lvl <7.0 (*)    All other components within normal limits  ACETAMINOPHEN LEVEL - Abnormal; Notable for the following components:   Acetaminophen (Tylenol), Serum <10 (*)    All other components within normal limits  RESP PANEL BY RT-PCR (FLU A&B, COVID) ARPGX2  LIPASE, BLOOD  URINALYSIS, ROUTINE W REFLEX MICROSCOPIC  URINE DRUG SCREEN, QUALITATIVE ([facility] ONLY)  POC URINE PREG, ED  PROCEDURES: Critical Care performed: No Procedures MEDICATIONS ORDERED IN ED: Medications  thiamine (B-1) injection 100 mg (100 mg Intravenous Given 03/11/22 1503)  folic acid injection 1 mg (has no administration in time range)  LORazepam (ATIVAN) tablet 1-4 mg (has no administration in time range)    Or  LORazepam (ATIVAN) injection 1-4 mg (has no administration in time range)  thiamine tablet 100 mg (has no administration in time range)    Or  thiamine (B-1) injection 100 mg (has no administration in time range)  folic acid (FOLVITE) tablet 1 mg (has no administration in time range)  multivitamin with minerals tablet 1 tablet (has no administration in time range)  sodium chloride 0.9 % bolus 1,000 mL (1,000 mLs Intravenous New Bag/Given 03/11/22 1503)   IMPRESSION / MDM / ASSESSMENT AND PLAN / ED COURSE  I reviewed the triage vital signs and the nursing  notes.                             The patient is on the cardiac monitor to evaluate for evidence of arrhythmia and/or significant heart rate changes. Patient's presentation is most consistent with acute presentation with potential threat to life or bodily function. Thoughts are linear and organized, and patient has no AH, VH, or HI. Prior suicide attempt: Denies Prior Psychiatric Hospitalizations: Denies  Clinically patient displays alcohol toxidrome without delirium; they are well appearing, with low suspicion for toxic ingestion given history and exam. Thoughts unlikely 2/2 anemia, hypothyroidism, infection, or ICH.  Consult: Psychiatry to evaluate patient for potential hold for danger to self. Disposition: Plan admit to psychiatry for further management of symptoms.    FINAL CLINICAL IMPRESSION(S) / ED DIAGNOSES   Final diagnoses:  Alcohol abuse  Alcoholic intoxication without complication (HCC)  Suicidal ideation   Rx / DC Orders   ED Discharge Orders     None      Note:  This document was prepared using Dragon voice recognition software and may include unintentional dictation errors.   Merwyn Katos, MD 03/11/22 [phone]

## 2022-03-11 NOTE — ED Notes (Signed)
Pt given sandwich tray and drink. 

## 2022-03-11 NOTE — ED Triage Notes (Signed)
Pt states that she has been on a drinking bender for 5 days with last drink this AM- pt states that she drinks 2-3 bottles of wine daily- pt has never had seizures with withdrawals- pt does wish to have a psych eval, pt has had passing thoughts of SI but states she would never act on it

## 2022-03-11 NOTE — BH Assessment (Signed)
Patient has been accepted to [facility].  Patient assigned to [facility] Accepting physician is Dr. Roselyn Reef.  Call report to [phone].  Representative was [employer].   ER Staff is aware of it:  Olegario Messier, ER Kizzie Furnish, Patient's Nurse    Patient bed available tomorrow (03/12/2022) after 8am.  Address: [address]  [place], [place] [address]
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
## 2022-03-11 NOTE — ED Notes (Signed)
Pt's mom came and told this tech that her daughter was having problems breathing. Pt was talking. I checked vital signs. All vitals were within normal limits except respirations, they were 26. This tech encouraged her to do breathing exercises and try to relax. I told her I would inform her RN and ask if she could have something to eat. Pt and Mom were content with that outcome and reassured.

## 2022-03-12 NOTE — ED Notes (Signed)
Pt signed consent to transfer form.

## 2022-03-12 NOTE — ED Notes (Signed)
Pt requested shower; provided clean hospital clothing and linens.  Shower setup provided with soap, shampoo, toothbrush/toothpaste, and deodorant.  Pt able to preform own ADL's with no assistance.    

## 2022-03-12 NOTE — ED Provider Notes (Signed)
Emergency Medicine Observation Re-evaluation Note  Monique Kaufman is a 36 y.o. female, seen on rounds today.  Pt initially presented to the ED for complaints of Alcohol Problem Currently, the patient is resting, voices no medical complaints.  Physical Exam  BP 121/75   Pulse 72   Temp 98.1 F (36.7 C) (Oral)   Resp (!) 26   Ht 5\' 4"  (1.626 m)   Wt 56.7 kg   SpO2 100%   BMI 21.46 kg/m  Physical Exam General: Resting in no acute distress Cardiac: No cyanosis Lungs: Equal rise and fall Psych: Not agitated  ED Course / MDM  EKG:   I have reviewed the labs performed to date as well as medications administered while in observation.  Recent changes in the last 24 hours include no events overnight.  Plan  Current plan is for psychiatric disposition.  Monique Kaufman is not under involuntary commitment.     , MD 03/12/22 [phone]

## 2022-05-16 ENCOUNTER — Other Ambulatory Visit: Payer: Self-pay

## 2022-05-16 ENCOUNTER — Encounter: Payer: Self-pay | Admitting: Emergency Medicine

## 2022-05-16 ENCOUNTER — Emergency Department
Admission: EM | Admit: 2022-05-16 | Discharge: 2022-05-16 | Discharge status: Against Medical Advice | Payer: Medicaid Other | Attending: Student in an Organized Health Care Education/Training Program | Admitting: Student in an Organized Health Care Education/Training Program

## 2022-05-16 DIAGNOSIS — F419 Anxiety disorder, unspecified: Secondary | ICD-10-CM | POA: Insufficient documentation

## 2022-05-16 DIAGNOSIS — F10120 Alcohol abuse with intoxication, uncomplicated: Secondary | ICD-10-CM | POA: Diagnosis not present

## 2022-05-16 DIAGNOSIS — S60561A Insect bite (nonvenomous) of right hand, initial encounter: Secondary | ICD-10-CM | POA: Diagnosis not present

## 2022-05-16 DIAGNOSIS — W57XXXA Bitten or stung by nonvenomous insect and other nonvenomous arthropods, initial encounter: Secondary | ICD-10-CM | POA: Insufficient documentation

## 2022-05-16 DIAGNOSIS — S0086XA Insect bite (nonvenomous) of other part of head, initial encounter: Secondary | ICD-10-CM | POA: Insufficient documentation

## 2022-05-16 DIAGNOSIS — S60562A Insect bite (nonvenomous) of left hand, initial encounter: Secondary | ICD-10-CM | POA: Insufficient documentation

## 2022-05-16 DIAGNOSIS — Y908 Blood alcohol level of 240 mg/100 ml or more: Secondary | ICD-10-CM | POA: Diagnosis not present

## 2022-05-16 DIAGNOSIS — Z5321 Procedure and treatment not carried out due to patient leaving prior to being seen by health care provider: Secondary | ICD-10-CM | POA: Diagnosis not present

## 2022-05-16 DIAGNOSIS — F32A Depression, unspecified: Secondary | ICD-10-CM | POA: Diagnosis not present

## 2022-05-16 LAB — COMPREHENSIVE METABOLIC PANEL
ALT: 26 U/L (ref 0–44)
AST: 31 U/L (ref 15–41)
Albumin: 5.1 g/dL — ABNORMAL HIGH (ref 3.5–5.0)
Alkaline Phosphatase: 127 U/L — ABNORMAL HIGH (ref 38–126)
Anion gap: 15 (ref 5–15)
BUN: 13 mg/dL (ref 6–20)
CO2: 19 mmol/L — ABNORMAL LOW (ref 22–32)
Calcium: 9.3 mg/dL (ref 8.9–10.3)
Chloride: 105 mmol/L (ref 98–111)
Creatinine, Ser: 0.65 mg/dL (ref 0.44–1.00)
GFR, Estimated: >60 mL/min (ref >60)
Glucose, Bld: 113 mg/dL — ABNORMAL HIGH (ref 70–99)
Potassium: 4 mmol/L (ref 3.5–5.1)
Sodium: 139 mmol/L (ref 135–145)
Total Bilirubin: 0.8 mg/dL (ref 0.3–1.2)
Total Protein: 8.3 g/dL — ABNORMAL HIGH (ref 6.5–8.1)

## 2022-05-16 LAB — CBC
HCT: 48.1 % — ABNORMAL HIGH (ref 36.0–46.0)
Hemoglobin: 16.5 g/dL — ABNORMAL HIGH (ref 12.0–15.0)
MCH: 30.8 pg (ref 26.0–34.0)
MCHC: 34.3 g/dL (ref 30.0–36.0)
MCV: 89.7 fL (ref 80.0–100.0)
Platelets: 301 10*3/uL (ref 150–400)
RBC: 5.36 MIL/uL — ABNORMAL HIGH (ref 3.87–5.11)
RDW: 11.9 % (ref 11.5–15.5)
WBC: 8 10*3/uL (ref 4.0–10.5)
nRBC: 0 % (ref 0.0–0.2)

## 2022-05-16 LAB — ETHANOL: Alcohol, Ethyl (B): 252 mg/dL — ABNORMAL HIGH (ref <10)

## 2022-05-16 NOTE — ED Triage Notes (Signed)
First Nurse Note:  Arrives via [facility] for alcohol detox.   Per EMS, patient drank 1.5 bottles of wine today..  VS wnl.

## 2022-05-16 NOTE — ED Triage Notes (Signed)
Patient brought into triage in wheelchair with her sponsor c/o possible alcohol poisoning, depression, anxiety and bug bites on face and hands. Has not taken zoloft in about 9 days due to alcohol intake and does not like to mix the two. Reports heavy alcohol intake over the past 9 days. Patient states she is staying at a hotel and has bites/ welts all over her body.

## 2022-08-08 ENCOUNTER — Encounter (HOSPITAL_COMMUNITY): Payer: Self-pay

## 2022-08-08 ENCOUNTER — Emergency Department (HOSPITAL_COMMUNITY): Payer: Medicaid Other

## 2022-08-08 ENCOUNTER — Emergency Department (HOSPITAL_COMMUNITY)
Admission: EM | Admit: 2022-08-08 | Discharge: 2022-08-08 | Disposition: A | Discharge status: Home / Self Care | Payer: Medicaid Other | Attending: Emergency Medicine | Admitting: Emergency Medicine

## 2022-08-08 DIAGNOSIS — S3991XA Unspecified injury of abdomen, initial encounter: Secondary | ICD-10-CM | POA: Diagnosis present

## 2022-08-08 DIAGNOSIS — Y9279 Other farm location as the place of occurrence of the external cause: Secondary | ICD-10-CM | POA: Diagnosis not present

## 2022-08-08 DIAGNOSIS — W3089XA Contact with other specified agricultural machinery, initial encounter: Secondary | ICD-10-CM | POA: Diagnosis not present

## 2022-08-08 DIAGNOSIS — S301XXA Contusion of abdominal wall, initial encounter: Secondary | ICD-10-CM | POA: Diagnosis not present

## 2022-08-08 DIAGNOSIS — R079 Chest pain, unspecified: Secondary | ICD-10-CM | POA: Insufficient documentation

## 2022-08-08 DIAGNOSIS — S40021A Contusion of right upper arm, initial encounter: Secondary | ICD-10-CM | POA: Diagnosis not present

## 2022-08-08 DIAGNOSIS — S50311A Abrasion of right elbow, initial encounter: Secondary | ICD-10-CM | POA: Insufficient documentation

## 2022-08-08 LAB — COMPREHENSIVE METABOLIC PANEL
ALT: 22 U/L (ref 0–44)
AST: 24 U/L (ref 15–41)
Albumin: 4.4 g/dL (ref 3.5–5.0)
Alkaline Phosphatase: 58 U/L (ref 38–126)
Anion gap: 8 (ref 5–15)
BUN: 16 mg/dL (ref 6–20)
CO2: 26 mmol/L (ref 22–32)
Calcium: 9.2 mg/dL (ref 8.9–10.3)
Chloride: 105 mmol/L (ref 98–111)
Creatinine, Ser: 1.02 mg/dL — ABNORMAL HIGH (ref 0.44–1.00)
GFR, Estimated: >60 mL/min (ref >60)
Glucose, Bld: 109 mg/dL — ABNORMAL HIGH (ref 70–99)
Potassium: 4 mmol/L (ref 3.5–5.1)
Sodium: 139 mmol/L (ref 135–145)
Total Bilirubin: 0.8 mg/dL (ref 0.3–1.2)
Total Protein: 6.6 g/dL (ref 6.5–8.1)

## 2022-08-08 LAB — I-STAT BETA HCG BLOOD, ED (MC, WL, AP ONLY): I-stat hCG, quantitative: <5 m[IU]/mL (ref <5)

## 2022-08-08 LAB — LACTIC ACID, PLASMA: Lactic Acid, Venous: 1.6 mmol/L (ref 0.5–1.9)

## 2022-08-08 LAB — I-STAT CHEM 8, ED
BUN: 18 mg/dL (ref 6–20)
Calcium, Ion: 1.19 mmol/L (ref 1.15–1.40)
Chloride: 101 mmol/L (ref 98–111)
Creatinine, Ser: 0.8 mg/dL (ref 0.44–1.00)
Glucose, Bld: 102 mg/dL — ABNORMAL HIGH (ref 70–99)
HCT: 42 % (ref 36.0–46.0)
Hemoglobin: 14.3 g/dL (ref 12.0–15.0)
Potassium: 3.9 mmol/L (ref 3.5–5.1)
Sodium: 138 mmol/L (ref 135–145)
TCO2: 28 mmol/L (ref 22–32)

## 2022-08-08 LAB — CBC
HCT: 40 % (ref 36.0–46.0)
Hemoglobin: 13.6 g/dL (ref 12.0–15.0)
MCH: 32.6 pg (ref 26.0–34.0)
MCHC: 34 g/dL (ref 30.0–36.0)
MCV: 95.9 fL (ref 80.0–100.0)
Platelets: 234 10*3/uL (ref 150–400)
RBC: 4.17 MIL/uL (ref 3.87–5.11)
RDW: 12 % (ref 11.5–15.5)
WBC: 8 10*3/uL (ref 4.0–10.5)
nRBC: 0 % (ref 0.0–0.2)

## 2022-08-08 LAB — PROTIME-INR
INR: 1.1 (ref 0.8–1.2)
Prothrombin Time: 14.2 seconds (ref 11.4–15.2)

## 2022-08-08 LAB — SAMPLE TO BLOOD BANK

## 2022-08-08 LAB — ETHANOL: Alcohol, Ethyl (B): <10 mg/dL (ref <10)

## 2022-08-08 MED ORDER — FENTANYL CITRATE PF 50 MCG/ML IJ SOSY
50.0000 ug | PREFILLED_SYRINGE | Freq: Once | INTRAMUSCULAR | Status: AC
  Administered 2022-08-08: 50 ug via INTRAVENOUS
  Filled 2022-08-08: qty 1

## 2022-08-08 MED ORDER — BACITRACIN ZINC 500 UNIT/GM EX OINT
TOPICAL_OINTMENT | Freq: Two times a day (BID) | CUTANEOUS | Status: DC
  Filled 2022-08-08: qty 0.9
  Filled 2022-08-08 (×2): qty 3.6

## 2022-08-08 MED ORDER — IOHEXOL 350 MG/ML SOLN
100.0000 mL | Freq: Once | INTRAVENOUS | Status: AC | PRN
  Administered 2022-08-08: 100 mL via INTRAVENOUS
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
## 2022-08-08 NOTE — Progress Notes (Signed)
Orthopedic Tech Progress Note Patient Details:  Monique Kaufman [birth year] [id number]  Level 2 trauma   Patient ID: Monique Kaufman, female   DOB: [birth year], 36 y.o.   MRN: [id number]  [name] 08/08/2022, 11:42 AM

## 2022-08-08 NOTE — ED Notes (Signed)
Pt changed position from laying down to sitting. Pt became pale and dizzy. Will continue to monitor.
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
## 2022-08-08 NOTE — ED Provider Notes (Signed)
[facility] EMERGENCY DEPARTMENT Provider Note   CSN: [id number] Arrival date & time: 08/08/22  1128     History  Chief Complaint  Patient presents with   Arm Injury    Monique Kaufman is a 36 y.o. female.  She is a level 2 trauma activation for chest and abdominal injury.  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  She had some crush injury to her right arm right chest right abdomen.  There is no loss of consciousness.  EMS that she had some transient hypotension which improved during transport.  She denies any head or neck injury.  She has pain when she takes a deep breath.  No numbness or weakness.  Tetanus is up-to-date.  The history is provided by the patient and the EMS personnel.  Trauma Mechanism of injury: Crush injury Injury location: torso Injury location detail: R chest, R flank, abd RUQ, abd RLQ and back Incident location: around machinery  Crush injury:      Mechanism: farm machinery   EMS/PTA data:      Bystander interventions: none      Blood loss: none      Responsiveness: alert      Oriented to: person, place, situation and time      Loss of consciousness: no      Airway interventions: none      Breathing interventions: none      IV access: established      Fluids administered: normal saline      Cardiac interventions: none      Medications administered: none      Airway condition since incident: stable      Breathing condition since incident: stable      Circulation condition since incident: stable      Mental status condition since incident: stable      Disability condition since incident: stable  Current symptoms:      Pain quality: aching      Associated symptoms:            Reports abdominal pain, back pain and chest pain.            Denies headache, loss of consciousness, nausea and vomiting.   Relevant PMH:      Pharmacological risk  factors:            No anticoagulation therapy.       Tetanus status: UTD      Home Medications Prior to Admission medications   Medication Sig Start Date End Date Taking? Authorizing Provider  chlordiazePOXIDE (LIBRIUM) 25 MG capsule Take 1 capsule (25 mg total) by mouth 2 (two) times daily as needed for anxiety or withdrawal. Start tonight after going home. Patient not taking: Reported on 05/20/2021 02/23/21   Clapacs, Madie Reno, MD  medroxyPROGESTERone Acetate 150 MG/ML SUSY INJECT 1 ML (150 MG TOTAL) INTO THE MUSCLE EVERY 3 (THREE) MONTHS. Patient not taking: Reported on 03/11/2022 08/15/20   Malachy Mood, MD  sertraline (ZOLOFT) 50 MG tablet Take 50 mg by mouth daily. 04/26/21   [provider]      Allergies    Patient has no known allergies.    Review of Systems   Review of Systems  Constitutional:  Negative for fever.  HENT:  Negative for sore throat.   Eyes:  Negative for visual disturbance.  Respiratory:  Negative for shortness  of breath.   Cardiovascular:  Positive for chest pain.  Gastrointestinal:  Positive for abdominal pain. Negative for nausea and vomiting.  Genitourinary:  Negative for dysuria.  Musculoskeletal:  Positive for back pain.  Skin:  Negative for rash.  Neurological:  Negative for loss of consciousness and headaches.    Physical Exam Updated Vital Signs BP 110/80 (BP Location: Left Arm)   Pulse (!) 55   Temp (!) 97.5 F (36.4 C) (Oral)   Resp 13   Ht 5\' 4"  (1.626 m)   Wt 56.7 kg   SpO2 99%   BMI 21.46 kg/m  Physical Exam Vitals and nursing note reviewed.  Constitutional:      General: She is not in acute distress.    Appearance: Normal appearance. She is well-developed.  HENT:     Head: Normocephalic and atraumatic.  Eyes:     Conjunctiva/sclera: Conjunctivae normal.  Cardiovascular:     Rate and Rhythm: Normal rate and regular rhythm.     Heart sounds: No murmur heard. Pulmonary:     Effort: Pulmonary effort is normal. No  respiratory distress.     Breath sounds: Normal breath sounds.     Comments: Some tenderness to her right lower chest Abdominal:     Palpations: Abdomen is soft.     Tenderness: There is abdominal tenderness.     Comments: She has tenderness over her right upper and lower quadrant and right flank.  There is soft tissue abrasions to the skin in that area from her lower chest to her pelvis.  Musculoskeletal:        General: Tenderness present. No deformity. Normal range of motion.     Cervical back: Neck supple.     Comments: She has some mild tenderness around her right elbow and some superficial abrasions  Skin:    General: Skin is warm and dry.     Capillary Refill: Capillary refill takes less than 2 seconds.  Neurological:     General: No focal deficit present.     Mental Status: She is alert.     Sensory: No sensory deficit.     Motor: No weakness.     ED Results / Procedures / Treatments   Labs (all labs ordered are listed, but only abnormal results are displayed) Labs Reviewed  COMPREHENSIVE METABOLIC PANEL - Abnormal; Notable for the following components:      Result Value   Glucose, Bld 109 (*)    Creatinine, Ser 1.02 (*)    All other components within normal limits  I-STAT CHEM 8, ED - Abnormal; Notable for the following components:   Glucose, Bld 102 (*)    All other components within normal limits  CBC  ETHANOL  LACTIC ACID, PLASMA  PROTIME-INR  URINALYSIS, ROUTINE W REFLEX MICROSCOPIC  I-STAT BETA HCG BLOOD, ED ([facility], [facility], [facility] ONLY)  SAMPLE TO BLOOD BANK    EKG None  Radiology CT CHEST ABDOMEN PELVIS W CONTRAST  Result Date: 08/08/2022 CLINICAL DATA:  Polytrauma, blunt. EXAM: CT CHEST, ABDOMEN, AND PELVIS WITH CONTRAST TECHNIQUE: Multidetector CT imaging of the chest, abdomen and pelvis was performed following the standard protocol during bolus administration of intravenous contrast. RADIATION DOSE REDUCTION: This exam was performed according to the  departmental dose-optimization program which includes automated exposure control, adjustment of the mA and/or kV according to patient size and/or use of iterative reconstruction technique. CONTRAST:  166mL OMNIPAQUE IOHEXOL 350 MG/ML SOLN COMPARISON:  None Available. FINDINGS: CT CHEST FINDINGS Cardiovascular: No significant  vascular findings. Normal heart size. No pericardial effusion. Mediastinum/Nodes: Crescentic soft tissue in the anterior mediastinum which conforms with underlying vasculature with preservation of the fat planes compatible with remnant thymic tissue. No suspicious thyroid nodule. No pathologically enlarged thoracic lymph node. The esophagus is grossly unremarkable. Lungs/Pleura: No parenchymal contusion or laceration. No pleural effusion. No pneumothorax. Musculoskeletal: No acute osseous abnormality. CT ABDOMEN PELVIS FINDINGS Hepatobiliary: No hepatic injury or perihepatic hematoma. Gallbladder is unremarkable. Pancreas: Unremarkable. No pancreatic ductal dilatation or surrounding inflammatory changes. Spleen: No splenic injury or perisplenic hematoma. Adrenals/Urinary Tract: No adrenal hemorrhage or renal injury identified. Bladder is unremarkable. Stomach/Bowel: Stomach is within normal limits. No evidence of bowel wall thickening, distention, or inflammatory changes. Vascular/Lymphatic: No significant vascular findings are present. No enlarged abdominal or pelvic lymph nodes. Reproductive: Uterus and bilateral adnexa are within normal limits in CT appearance for reproductive age female. Other: Soft tissue thickening with subcutaneous edema along the right flank on image 87/3. Trace pelvic free fluid is within physiologic normal limits. Musculoskeletal: No fracture is seen. IMPRESSION: 1. Soft tissue thickening with subcutaneous edema along the right flank, compatible with a soft tissue contusion. 2. No other acute traumatic findings identified in the chest, abdomen, or pelvis.  Electronically Signed   By: Maudry Mayhew M.D.   On: 08/08/2022 13:19   DG Pelvis Portable  Result Date: 08/08/2022 CLINICAL DATA:  Trauma EXAM: PORTABLE PELVIS 1-2 VIEWS COMPARISON:  None Available. FINDINGS: There is no evidence of pelvic fracture or diastasis. No pelvic bone lesions are seen. IMPRESSION: No displaced fracture or dislocation is seen. Electronically Signed   By: Ernie Avena M.D.   On: 08/08/2022 12:09   DG Elbow 2 Views Right  Result Date: 08/08/2022 CLINICAL DATA:  Trauma, pain EXAM: RIGHT ELBOW - 2 VIEW COMPARISON:  None Available. FINDINGS: There is no evidence of fracture, dislocation, or joint effusion. There is no evidence of arthropathy or other focal bone abnormality. Soft tissues are unremarkable. IMPRESSION: No fracture or dislocation is seen in right elbow. Electronically Signed   By: Ernie Avena M.D.   On: 08/08/2022 12:08   DG Chest Port 1 View  Result Date: 08/08/2022 CLINICAL DATA:  Trauma EXAM: PORTABLE CHEST 1 VIEW COMPARISON:  None Available. FINDINGS: The heart size and mediastinal contours are within normal limits. Both lungs are clear. The visualized skeletal structures are unremarkable. IMPRESSION: No active disease. Electronically Signed   By: Ernie Avena M.D.   On: 08/08/2022 12:08   DG Forearm Right  Result Date: 08/08/2022 CLINICAL DATA:  Trauma, pain EXAM: RIGHT FOREARM - 2 VIEW COMPARISON:  None Available. FINDINGS: There is no evidence of fracture or other focal bone lesions. Soft tissues are unremarkable. IMPRESSION: No fracture or dislocation is seen in right forearm. Electronically Signed   By: Ernie Avena M.D.   On: 08/08/2022 12:07    Procedures Procedures    Medications Ordered in ED Medications  bacitracin ointment ( Topical Given 08/08/22 1535)  fentaNYL (SUBLIMAZE) injection 50 mcg (50 mcg Intravenous Given 08/08/22 1157)  iohexol (OMNIPAQUE) 350 MG/ML injection 100 mL (100 mLs Intravenous Contrast  Given 08/08/22 1306)    ED Course/ Medical Decision Making/ A&P Clinical Course as of 08/08/22 1705  Wed Aug 08, 2022  1151 Chest and pelvics x-ray do not show any acute traumatic findings.  Awaiting radiology reading. [MB]    Clinical Course User Index [MB] Terrilee Files, MD  Medical Decision Making Amount and/or Complexity of Data Reviewed Labs: ordered. Radiology: ordered.  Risk OTC drugs. Prescription drug management.   This patient complains of traumatic injury to right chest abdominal wall and right arm; this involves an extensive number of treatment Options and is a complaint that carries with it a high risk of complications and morbidity. The differential includes contusion, fracture, intra-abdominal injury, abrasion  I ordered, reviewed and interpreted labs, which included CBC with normal white count normal hemoglobin, chemistries with normal renal function, lactate normal, pregnancy test negative I ordered medication IV pain medication and reviewed PMP when indicated. I ordered imaging studies which included x-rays of chest and pelvis right elbow right forearm and CT chest abdomen and pelvis and I independently    visualized and interpreted imaging which showed no significant traumatic injuries Additional history obtained from EMS Previous records obtained and reviewed in epic no recent admissions Cardiac monitoring reviewed, normal sinus rhythm Social determinants considered, no significant barriers Critical Interventions: None  After the interventions stated above, I reevaluated the patient and found patient to be symptomatically improved.  She did become pale and dizzy when standing so she is going to eat and take some fluids before we try her again for discharge Admission and further testing considered, no indications for admission or further work-up at this time.  Return instructions discussed.         Final Clinical  Impression(s) / ED Diagnoses Final diagnoses:  Contusion of abdominal wall, initial encounter  Arm contusion, right, initial encounter    Rx / DC Orders ED Discharge Orders     None         Hayden Rasmussen, MD 08/08/22 1707

## 2022-08-24 ENCOUNTER — Other Ambulatory Visit: Payer: Self-pay

## 2022-08-24 ENCOUNTER — Encounter (HOSPITAL_BASED_OUTPATIENT_CLINIC_OR_DEPARTMENT_OTHER): Payer: Self-pay | Admitting: Emergency Medicine

## 2022-08-24 ENCOUNTER — Emergency Department (HOSPITAL_BASED_OUTPATIENT_CLINIC_OR_DEPARTMENT_OTHER)
Admission: EM | Admit: 2022-08-24 | Discharge: 2022-08-24 | Disposition: A | Discharge status: Home / Self Care | Payer: Medicaid Other | Attending: Emergency Medicine | Admitting: Emergency Medicine

## 2022-08-24 ENCOUNTER — Emergency Department (HOSPITAL_BASED_OUTPATIENT_CLINIC_OR_DEPARTMENT_OTHER): Payer: Medicaid Other

## 2022-08-24 DIAGNOSIS — R2231 Localized swelling, mass and lump, right upper limb: Secondary | ICD-10-CM

## 2022-08-24 DIAGNOSIS — M546 Pain in thoracic spine: Secondary | ICD-10-CM

## 2022-08-24 MED ORDER — IBUPROFEN 800 MG PO TABS
800.0000 mg | ORAL_TABLET | Freq: Once | ORAL | Status: AC
  Administered 2022-08-24: 800 mg via ORAL
  Filled 2022-08-24: qty 1

## 2022-08-24 NOTE — ED Triage Notes (Signed)
Pt arrives to ED with c/o on-going arm and back pain. She noted a crush injury from an auger on 11/15 and was seen at [facility] ED.

## 2022-08-24 NOTE — ED Notes (Signed)
US tech at bedside

## 2022-08-24 NOTE — ED Notes (Signed)
Discharge instructions, pain management, and follow up care reviewed and explained. Pt verbalized understanding and had no further questions on d/c. Pt caox4 and ambulatory on d/c.  

## 2022-08-24 NOTE — ED Provider Notes (Signed)
[facility] EMERGENCY DEPT Provider Note   CSN: [id number] Arrival date & time: 08/24/22  1315     History  Chief Complaint  Patient presents with   Arm Injury   Back Injury    Monique Kaufman is a 36 y.o. female presents to the ER with complaint of right upper extremity mass and continued back pain following an accident that happened on 08/08/2022.  Patient was working using a Theatre manager to Engineer, materials and had her clothing caught up in [employer] which pulled her into the machine.  She states her injuries have been healing and improving, however, a fluctuant mass on her right bicep has not resolved and she has numbness in the area.  Patient is concerned for underlying muscle injury.  She reports her back pain is positional and relieved with ibuprofen.       Home Medications Prior to Admission medications   Medication Sig Start Date End Date Taking? Authorizing Provider  chlordiazePOXIDE (LIBRIUM) 25 MG capsule Take 1 capsule (25 mg total) by mouth 2 (two) times daily as needed for anxiety or withdrawal. Start tonight after going home. Patient not taking: Reported on 05/20/2021 02/23/21   Clapacs, Jackquline Denmark, MD  medroxyPROGESTERone Acetate 150 MG/ML SUSY INJECT 1 ML (150 MG TOTAL) INTO THE MUSCLE EVERY 3 (THREE) MONTHS. Patient not taking: Reported on 03/11/2022 08/15/20   Vena Austria, MD  sertraline (ZOLOFT) 50 MG tablet Take 50 mg by mouth daily. 04/26/21   [provider]      Allergies    Patient has no known allergies.    Review of Systems   Review of Systems  Constitutional:  Negative for fever.  Musculoskeletal:  Positive for back pain.  Neurological:  Positive for numbness. Negative for weakness.       Numbness to the right inner bicep region with hematoma    Physical Exam Updated Vital Signs BP (!) 147/87 (BP Location: Left Arm)   Pulse 83   Temp (!) 97.5 F (36.4 C)   Resp 16   Ht 5\' 4"  (1.626 m)   Wt 56.7 kg   SpO2 99%   BMI 21.46  kg/m  Physical Exam Vitals and nursing note reviewed.  Constitutional:      General: She is not in acute distress.    Appearance: Normal appearance. She is not ill-appearing or diaphoretic.  Pulmonary:     Effort: Pulmonary effort is normal.  Musculoskeletal:     Right upper arm: Swelling present. No tenderness.     Right elbow: Tenderness present in olecranon process.       Arms:     Thoracic back: Tenderness present. No bony tenderness.     Lumbar back: Tenderness present. No bony tenderness.     Comments: Fluctuant mass to right inner bicep that has decreased sensation to touch, non tender to palpation, purple in color.  Healing abrasions to right flank and hip.  No bony tenderness to thoracic or lumbar spine.  Tenderness to palpation of paraspinal muscles in the thoracolumbar region.  Normal ROM of spine.    Neurological:     Mental Status: She is alert. Mental status is at baseline.  Psychiatric:        Mood and Affect: Mood normal.        Behavior: Behavior normal.     ED Results / Procedures / Treatments   Labs (all labs ordered are listed, but only abnormal results are displayed) Labs Reviewed - No data to  display  EKG None  Radiology No results found.  Procedures Procedures    Medications Ordered in ED Medications  ibuprofen (ADVIL) tablet 800 mg (800 mg Oral Given 08/24/22 1437)    ED Course/ Medical Decision Making/ A&P                           Medical Decision Making Amount and/or Complexity of Data Reviewed Radiology: ordered.  Risk Prescription drug management.   This patient presents to the ED with chief complaint(s) of right upper arm mass and numbness; back pain with pertinent past medical history of crush injury due to heavy equipment, alcohol use disorder.  The complaint involves an extensive differential diagnosis and also carries with it a high risk of complications and morbidity.    The differential diagnosis includes vascular injury  to right upper extremity, hematoma, muscular tear, muscle strain of back, muscle spasm of back  The initial plan is to obtain US soft tissue of right upper extremity   Initial Assessment:   On exam, patient is resting comfortably in bed.  She is able to sit up without difficulty, but states her back pain increases with change in positions.  No bony tenderness to palpation in thoracolumbar region, she does have tenderness to palpation of paraspinal muscles.  Right upper extremity has fluctuant, purple mass to the inner bicep region.  Mass is non-tender to palpation, is numb.  Skin around mass has normal sensation.    Independent ECG/labs interpretation:  I do not feel that laboratory workup is warranted at this time.   Independent visualization and interpretation of imaging: US soft tissue right upper extremity is still pending.    Treatment and Reassessment: Will treat patient's back pain with ibuprofen.  Other treatment options considered:   Muscle relaxers were considered and discussed with patient, however, patient states she is in recovery and requests only ibuprofen for her discomfort and back spasms.   Disposition:   After consideration of the diagnostic results and the patients response to treatment, I feel that emergency department workup does not suggest an emergent condition requiring admission or immediate intervention beyond what has been performed at this time.  Discussed with patient supportive care measures for back pain at home.  [place] of right upper extremity still pending, but plan to discharge patient home with PCP follow-up should imaging reveal no need for intervention, suspect it is a healing hematoma from her recent trauma.    3:20 PM Care transferred to [facility] and Dr. Renaye Rakers at the end of my shift as the patient will require reassessment once labs/imaging have resulted. Patient presentation, ED course, and plan of care discussed with review of all pertinent labs and  imaging. Please see his/her note for further details regarding further ED course and disposition. Plan at time of handoff is discharge home following imaging study. This may be altered or completely changed at the discretion of the oncoming team pending results of further workup.          Final Clinical Impression(s) / ED Diagnoses Final diagnoses:  Acute bilateral thoracic back pain  Arm mass, right    Rx / DC Orders ED Discharge Orders     None         Lenard Simmer, [place] 08/24/22 1520    Pricilla Loveless, MD 08/28/22 [phone]

## 2022-08-24 NOTE — Discharge Instructions (Addendum)
Thank you for allowing me to be part of your care today.  You were seen in the ER for back pain and a mass on your right arm.  Your ultrasound is reassuring, suggestive of a hematoma, and I do not believe further work-up is required at this time.   You may continue to use 800mg  ibuprofen every 6-8 hours as needed for back pain.  Be sure to stay well hydrated while taking this medication and take with food if stomach upset occurs.   I recommend follow-up with your primary care physician for reassessment of your right arm.   Return to the ER if you develop any new or worsening symptoms or have any new concerns.

## 2022-08-24 NOTE — ED Provider Notes (Signed)
Signout from [employer] at shift change. Briefly, patient presents for left upper extremity swelling after recent accident.   Plan: Follow-up ultrasound   4:06 PM Reassessment performed. Patient appears stable.  Upper extremity is neurovascularly intact.  Area does not appear to be infected.  Labs and imaging results reviewed: Likely consistent with liquefied hematoma.   Most current vital signs reviewed and are as follows: BP (!) 147/87 (BP Location: Left Arm)   Pulse 83   Temp (!) 97.5 F (36.4 C)   Resp 16   Ht 5\' 4"  (1.626 m)   Wt 56.7 kg   SpO2 99%   BMI 21.46 kg/m   Plan: Discharge home   Home treatment: NSAIDs, ice/heat if needed   Return and follow-up instructions: Encouraged return to ED with worsening pain, swelling, numbness or tingling. Encouraged patient to follow-up with their provider as needed if not improving. Patient verbalized understanding and agreed with plan.     MDM: No sign of soft tissue infection, abscess, infected hematoma.  No signs of compartment syndrome.  Patient with localized numbness likely related to nerve injury from the injury, but no motor problems.  Upper extremity is neurovascularly intact.  Do not suspect arterial occlusion or DVT.   , PA-C 08/24/22 1608    14/01/23, MD 08/24/22 2259

## 2023-02-25 ENCOUNTER — Encounter: Payer: Self-pay | Admitting: Obstetrics and Gynecology

## 2023-02-25 ENCOUNTER — Ambulatory Visit (INDEPENDENT_AMBULATORY_CARE_PROVIDER_SITE_OTHER): Payer: Medicaid Other | Admitting: Obstetrics and Gynecology

## 2023-02-25 ENCOUNTER — Other Ambulatory Visit (HOSPITAL_COMMUNITY)
Admission: RE | Admit: 2023-02-25 | Discharge: 2023-02-25 | Disposition: A | Discharge status: Home / Self Care | Payer: Medicaid Other | Source: Ambulatory Visit | Attending: Obstetrics and Gynecology | Admitting: Obstetrics and Gynecology

## 2023-02-25 VITALS — BP 131/83 | HR 46 | Ht 64.0 in | Wt 125.0 lb

## 2023-02-25 DIAGNOSIS — Z3202 Encounter for pregnancy test, result negative: Secondary | ICD-10-CM

## 2023-02-25 DIAGNOSIS — Z3042 Encounter for surveillance of injectable contraceptive: Secondary | ICD-10-CM

## 2023-02-25 DIAGNOSIS — Z01419 Encounter for gynecological examination (general) (routine) without abnormal findings: Secondary | ICD-10-CM | POA: Insufficient documentation

## 2023-02-25 DIAGNOSIS — Z1339 Encounter for screening examination for other mental health and behavioral disorders: Secondary | ICD-10-CM

## 2023-02-25 LAB — POCT URINE PREGNANCY: Preg Test, Ur: NEGATIVE

## 2023-02-25 MED ORDER — MEDROXYPROGESTERONE ACETATE 150 MG/ML IM SUSP
150.0000 mg | Freq: Once | INTRAMUSCULAR | Status: AC
  Administered 2023-02-25: 150 mg via INTRAMUSCULAR

## 2023-02-25 MED ORDER — MEDROXYPROGESTERONE ACETATE 150 MG/ML IM SUSP: 150.0000 mg | INTRAMUSCULAR | 4 refills | Status: AC

## 2023-02-25 NOTE — Progress Notes (Signed)
GYNECOLOGY ANNUAL PREVENTATIVE CARE ENCOUNTER NOTE  History:     Monique Kaufman is a 37 y.o. G28P0101 female here for a routine annual gynecologic exam.  Current complaints: desires to restart depo provera.   Denies abnormal vaginal bleeding, discharge, pelvic pain, problems with intercourse or other gynecologic concerns.    Gynecologic History Patient's last menstrual period was 02/20/2023 (exact date). Contraception: abstinence Last Pap: 2022. 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 Last mammogram: n/a Obstetric History OB History  Gravida Para Term Preterm AB Living  1 1   1   1   SAB IAB Ectopic Multiple Live Births        0 1    # Outcome Date GA Lbr Len/2nd Weight Sex Delivery Anes PTL Lv  1 Preterm 01/25/17 [redacted]w[redacted]d  6 lb 1 oz (2.75 kg) M CS-LTranv EPI, Spinal  LIV    Past Medical History:  Diagnosis Date   Anxiety    Atypical squamous cell changes of undetermined significance (ASCUS) on cervical cytology with negative high risk human papilloma virus (HPV) test result 11/17/2014   Bulimia nervosa    Bunion 2001; 2006; 2007   BILATERAL   Chlamydia 11/17/2014   Depression    Heart murmur    SEPTAL DEFECT   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    Current Outpatient Medications on File Prior to Visit  Medication Sig Dispense Refill   sertraline (ZOLOFT) 25 MG tablet Take 25 mg by mouth daily.     No current facility-administered medications on file prior to visit.    No Known  Allergies  Social History:  reports that she has never smoked. She has never used smokeless tobacco. She reports that she does not currently use alcohol. She reports that she does not use drugs.  Family History  Problem Relation Age of Onset   Diabetes Father    Alcoholism Paternal Aunt    Alcoholism Maternal Grandfather    Cancer Paternal Grandfather    Breast cancer Neg Hx     The following portions of the patient's history were reviewed and updated as appropriate: allergies, current medications, past family history, past medical history, past social history, past surgical history and problem list.  Review of Systems Pertinent items noted in HPI and remainder of comprehensive ROS otherwise negative.  Physical Exam:  BP 131/83   Pulse (!) 46   Ht 5\' 4"  (1.626 m)   Wt 125 lb (56.7 kg)   LMP 02/20/2023 (Exact Date)   BMI 21.46 kg/m  CONSTITUTIONAL: Well-developed, well-nourished female in no acute distress.  HENT:  Normocephalic, atraumatic, External right and left ear normal. Oropharynx is clear and moist EYES: Conjunctivae and EOM are normal.  NECK: Normal range of motion, supple, no masses.  Normal thyroid.  SKIN: Skin is warm and dry. No rash noted. Not diaphoretic. No erythema. No pallor. MUSCULOSKELETAL: Normal range of motion. No tenderness.  No cyanosis, clubbing, or edema.  2+ distal pulses. NEUROLOGIC: Alert and oriented to person, place, and time. Normal reflexes, muscle tone coordination.  PSYCHIATRIC: Normal mood and affect. Normal behavior. Normal judgment and thought content. CARDIOVASCULAR: Normal heart rate noted, regular rhythm RESPIRATORY: Clear to auscultation bilaterally. Effort and breath sounds normal, no problems with respiration noted. BREASTS: Symmetric in size. No masses, tenderness, skin changes, nipple drainage, or lymphadenopathy bilaterally. Performed in the presence of a chaperone. ABDOMEN: Soft, no distention noted.  No tenderness, rebound or  guarding.  PELVIC: Normal appearing external genitalia and urethral meatus; normal appearing vaginal mucosa and cervix.  No abnormal discharge noted.  Pap smear obtained.  Normal uterine size, no other palpable masses, no uterine or adnexal tenderness.  Performed in the presence of a chaperone.   Assessment and Plan:    1. Women's annual routine gynecological examination [Z01.419] Normal annual exam Pt has hx of abnormal pap, if CIN 1 or higher will get colpo  - Cytology - PAP( [place])  2. Encounter for management and injection of depo-Provera  - POCT urine pregnancy - medroxyPROGESTERone (DEPO-PROVERA) 150 MG/ML injection; Inject 1 mL (150 mg total) into the muscle every 3 (three) months.  Dispense: 1 mL; Refill: 4 - medroxyPROGESTERone (DEPO-PROVERA) injection 150 mg  Will follow up results of pap smear and manage accordingly. Pt declined STD testing Routine preventative health maintenance measures emphasized. Please refer to After Visit Summary for other counseling recommendations.  F/u in 1 year unless pap is abnormal     Mariel Aloe, MD, FACOG Obstetrician & Gynecologist, [facility] [employer], [facility]

## 2023-02-25 NOTE — Progress Notes (Addendum)
37 y.o. New GYN presents for AEX/PAP.  Pt wants to start DEPO for [facility] today.  Last sexual intercourse was 3 yrs ago.  Last PAP in 2022 was Abnormal LSIL.  UPT today is  Negative  DEPO Injection given in RUOQ, tolerated well. Next DEPO due August 19 - Sept. 2, 2024  Administrations This Visit     medroxyPROGESTERone (DEPO-PROVERA) injection 150 mg     Admin Date 02/25/2023 Action Given Dose 150 mg Route Intramuscular Administered By Maretta Bees, RMA

## 2023-03-01 LAB — CYTOLOGY - PAP
Comment: NEGATIVE
Comment: NEGATIVE
Comment: NEGATIVE
HPV 16: NEGATIVE
HPV 18 / 45: NEGATIVE
High risk HPV: POSITIVE — AB

## 2023-04-02 ENCOUNTER — Encounter: Payer: MEDICAID | Admitting: Obstetrics and Gynecology

## 2023-04-03 ENCOUNTER — Emergency Department
Admission: EM | Admit: 2023-04-03 | Discharge: 2023-04-03 | Payer: MEDICAID | Attending: Emergency Medicine | Admitting: Emergency Medicine

## 2023-04-03 ENCOUNTER — Other Ambulatory Visit: Payer: Self-pay

## 2023-04-03 DIAGNOSIS — F1010 Alcohol abuse, uncomplicated: Secondary | ICD-10-CM | POA: Insufficient documentation

## 2023-04-03 DIAGNOSIS — Y906 Blood alcohol level of 120-199 mg/100 ml: Secondary | ICD-10-CM | POA: Diagnosis not present

## 2023-04-03 DIAGNOSIS — R531 Weakness: Secondary | ICD-10-CM | POA: Diagnosis not present

## 2023-04-03 DIAGNOSIS — R55 Syncope and collapse: Secondary | ICD-10-CM | POA: Insufficient documentation

## 2023-04-03 DIAGNOSIS — E860 Dehydration: Secondary | ICD-10-CM | POA: Insufficient documentation

## 2023-04-03 DIAGNOSIS — F10929 Alcohol use, unspecified with intoxication, unspecified: Secondary | ICD-10-CM | POA: Insufficient documentation

## 2023-04-03 LAB — CBC
HCT: 40.3 % (ref 36.0–46.0)
Hemoglobin: 14.4 g/dL (ref 12.0–15.0)
MCH: 32 pg (ref 26.0–34.0)
MCHC: 35.7 g/dL (ref 30.0–36.0)
MCV: 89.6 fL (ref 80.0–100.0)
Platelets: 273 10*3/uL (ref 150–400)
RBC: 4.5 MIL/uL (ref 3.87–5.11)
RDW: 11.9 % (ref 11.5–15.5)
WBC: 8.5 10*3/uL (ref 4.0–10.5)
nRBC: 0 % (ref 0.0–0.2)

## 2023-04-03 LAB — TROPONIN I (HIGH SENSITIVITY): Troponin I (High Sensitivity): 6 ng/L (ref <18)

## 2023-04-03 LAB — BASIC METABOLIC PANEL
Anion gap: 11 (ref 5–15)
BUN: 14 mg/dL (ref 6–20)
CO2: 23 mmol/L (ref 22–32)
Calcium: 8.5 mg/dL — ABNORMAL LOW (ref 8.9–10.3)
Chloride: 100 mmol/L (ref 98–111)
Creatinine, Ser: 0.62 mg/dL (ref 0.44–1.00)
GFR, Estimated: >60 mL/min (ref >60)
Glucose, Bld: 96 mg/dL (ref 70–99)
Potassium: 3.7 mmol/L (ref 3.5–5.1)
Sodium: 134 mmol/L — ABNORMAL LOW (ref 135–145)

## 2023-04-03 LAB — ETHANOL: Alcohol, Ethyl (B): 125 mg/dL — ABNORMAL HIGH (ref <10)

## 2023-04-03 MED ORDER — SODIUM CHLORIDE 0.9 % IV BOLUS
1000.0000 mL | Freq: Once | INTRAVENOUS | Status: AC
  Administered 2023-04-03: 1000 mL via INTRAVENOUS

## 2023-04-03 NOTE — ED Notes (Signed)
Pt mom taking pt to detox center.

## 2023-04-03 NOTE — ED Notes (Signed)
First Nurse Note: Pt to ED via [facility] from [facility] department for syncope. Pt coming in for ETOH withdrawal. Pt has hx/o ETOH abuse, was discharged from [facility] 1 month ago. Pt mom reports pt has been on the streets for the last 5 days. Pt VSS with EMS.

## 2023-04-03 NOTE — ED Provider Notes (Signed)
[facility] Provider Note    Event Date/Time   First MD Initiated Contact with Patient 04/03/23 1537     (approximate)   History   Near Syncope and Alcohol Intoxication   HPI  Monique Kaufman is a 37 y.o. female who presents to the emergency department today because of concerns for weakness and alcohol abuse.  Patient does have history of alcohol abuse.  Had been sober for roughly 9 months however further approximately the past week and started abusing alcohol again.  She states that she feels dehydrated.  She felt very weak today.     Physical Exam   Triage Vital Signs: ED Triage Vitals  Enc Vitals Group     BP 04/03/23 1408 (!) 172/102     Pulse Rate 04/03/23 1408 64     Resp 04/03/23 1408 18     Temp 04/03/23 1408 97.6 F (36.4 C)     Temp Source 04/03/23 1408 Oral     SpO2 04/03/23 1408 100 %     Weight 04/03/23 1406 120 lb (54.4 kg)     Height 04/03/23 1406 5\' 4"  (1.626 m)     Head Circumference --      Peak Flow --      Pain Score --      Pain Loc --      Pain Edu? --      Excl. in GC? --     Most recent vital signs: Vitals:   04/03/23 1408  BP: (!) 172/102  Pulse: 64  Resp: 18  Temp: 97.6 F (36.4 C)  SpO2: 100%   General: Awake, alert, oriented. CV:  Good peripheral perfusion. Regular rate and rhythm. Resp:  Normal effort. Lungs clear. Abd:  No distention.  Other:  No tremors. No hallucinations.   ED Results / Procedures / Treatments   Labs (all labs ordered are listed, but only abnormal results are displayed) Labs Reviewed  BASIC METABOLIC PANEL - Abnormal; Notable for the following components:      Result Value   Sodium 134 (*)    Calcium 8.5 (*)    All other components within normal limits  ETHANOL - Abnormal; Notable for the following components:   Alcohol, Ethyl (B) 125 (*)    All other components within normal limits  CBC  POC URINE PREG, ED  TROPONIN I (HIGH SENSITIVITY)     EKG  I, Phineas Semen, attending physician, personally viewed and interpreted this EKG  EKG Time: 1408 Rate: 65 Rhythm: normal sinus rhythm Axis: normal Intervals: qtc 418 QRS: narrow, LVH ST changes: no st elevation Impression: abnormal ekg   RADIOLOGY None  PROCEDURES:  Critical Care performed: No    MEDICATIONS ORDERED IN ED: Medications - No data to display   IMPRESSION / MDM / ASSESSMENT AND PLAN / ED COURSE  I reviewed the triage vital signs and the nursing notes.                              Differential diagnosis includes, but is not limited to, alcohol abuse, dehydration  Patient's presentation is most consistent with acute presentation with potential threat to life or bodily function.   Patient presented to the emergency department today because of concerns for weakness, near syncope in the setting of recent heavy alcohol use.  Patient's vital signs with slight hypertension although no tachycardia.  Blood work without any significant electrolyte abnormalities.  Patient and family requested IV fluids and quick discharge because they had already arranged patient to go to a detox facility today but had to be there by 5.  I think this is a reasonable and good plan for the patient.     FINAL CLINICAL IMPRESSION(S) / ED DIAGNOSES   Final diagnoses:  Alcohol abuse    Note:  This document was prepared using Dragon voice recognition software and may include unintentional dictation errors.    Phineas Semen, MD 04/03/23 [phone]

## 2023-04-03 NOTE — ED Triage Notes (Signed)
Pt via [facility] from home. Pt went on a 8 day drinking binger outside. Pt multiple bug bits. Pt having heart palpitations. Denies any reactional drug use. Last drink this AM. Pt had been 9 mo sober prior to this. Pt also feel lightheaded and feel like she about to pass out. No LOC. Report approx 1.5L each day. Pt is A&OX4 and NAD

## 2023-04-22 DIAGNOSIS — Z136 Encounter for screening for cardiovascular disorders: Secondary | ICD-10-CM | POA: Diagnosis not present

## 2023-04-22 DIAGNOSIS — F1011 Alcohol abuse, in remission: Secondary | ICD-10-CM | POA: Diagnosis not present

## 2023-04-22 DIAGNOSIS — Z30013 Encounter for initial prescription of injectable contraceptive: Secondary | ICD-10-CM | POA: Diagnosis not present

## 2023-04-22 DIAGNOSIS — M5137 Other intervertebral disc degeneration, lumbosacral region: Secondary | ICD-10-CM | POA: Diagnosis not present

## 2023-04-22 DIAGNOSIS — Z1159 Encounter for screening for other viral diseases: Secondary | ICD-10-CM | POA: Diagnosis not present

## 2023-04-22 DIAGNOSIS — I7300 Raynaud's syndrome without gangrene: Secondary | ICD-10-CM | POA: Diagnosis not present

## 2023-04-22 DIAGNOSIS — F411 Generalized anxiety disorder: Secondary | ICD-10-CM | POA: Diagnosis not present

## 2023-04-22 DIAGNOSIS — Z599 Problem related to housing and economic circumstances, unspecified: Secondary | ICD-10-CM | POA: Diagnosis not present

## 2023-04-22 DIAGNOSIS — F339 Major depressive disorder, recurrent, unspecified: Secondary | ICD-10-CM | POA: Diagnosis not present

## 2023-04-22 DIAGNOSIS — M545 Low back pain, unspecified: Secondary | ICD-10-CM | POA: Diagnosis not present

## 2023-04-22 DIAGNOSIS — G8929 Other chronic pain: Secondary | ICD-10-CM | POA: Diagnosis not present

## 2023-04-22 DIAGNOSIS — Z0000 Encounter for general adult medical examination without abnormal findings: Secondary | ICD-10-CM | POA: Diagnosis not present

## 2023-04-23 ENCOUNTER — Ambulatory Visit (INDEPENDENT_AMBULATORY_CARE_PROVIDER_SITE_OTHER): Payer: 59 | Admitting: Obstetrics and Gynecology

## 2023-04-23 ENCOUNTER — Encounter: Payer: Self-pay | Admitting: Obstetrics and Gynecology

## 2023-04-23 ENCOUNTER — Other Ambulatory Visit (HOSPITAL_COMMUNITY)
Admission: RE | Admit: 2023-04-23 | Discharge: 2023-04-23 | Disposition: A | Discharge status: Home / Self Care | Payer: 59 | Source: Ambulatory Visit | Attending: Obstetrics and Gynecology | Admitting: Obstetrics and Gynecology

## 2023-04-23 VITALS — BP 113/77 | HR 50 | Ht 64.0 in | Wt 130.0 lb

## 2023-04-23 DIAGNOSIS — R87612 Low grade squamous intraepithelial lesion on cytologic smear of cervix (LGSIL): Secondary | ICD-10-CM | POA: Insufficient documentation

## 2023-04-23 DIAGNOSIS — Z3202 Encounter for pregnancy test, result negative: Secondary | ICD-10-CM | POA: Diagnosis not present

## 2023-04-23 LAB — POCT URINE PREGNANCY: Preg Test, Ur: NEGATIVE

## 2023-04-23 NOTE — Progress Notes (Signed)
Colposcopy note:  Patient given informed consent, signed copy in the chart, time out was performed.  UPT negative.  Pap reviewed showing CIN 1.  Placed in lithotomy position. Cervix viewed with speculum and colposcope after application of acetic acid and Lugol's solution.   Colposcopy adequate?  yes Acetowhite lesions?  None externally, no nonstaining with lugol's Punctation?  None seen Mosaicism?  none Abnormal vasculature?  none Biopsies? none ECC?yes  COMMENTS: Patient was given post procedure instructions.      Warden Fillers, MD

## 2023-04-23 NOTE — Progress Notes (Signed)
UPT - Negative

## 2023-05-06 DIAGNOSIS — Z79899 Other long term (current) drug therapy: Secondary | ICD-10-CM | POA: Diagnosis not present

## 2023-05-07 ENCOUNTER — Encounter: Payer: MEDICAID | Admitting: Obstetrics and Gynecology

## 2023-05-15 DIAGNOSIS — Z79899 Other long term (current) drug therapy: Secondary | ICD-10-CM | POA: Diagnosis not present

## 2023-05-21 ENCOUNTER — Ambulatory Visit: Payer: Medicaid Other

## 2023-05-29 DIAGNOSIS — Z79899 Other long term (current) drug therapy: Secondary | ICD-10-CM | POA: Diagnosis not present

## 2023-06-07 DIAGNOSIS — Z79899 Other long term (current) drug therapy: Secondary | ICD-10-CM | POA: Diagnosis not present
